# Patient Record
Sex: Female | Born: 1960
Health system: Southern US, Community
[De-identification: ages and names within clinical notes are randomized; demographics above are authoritative.]

## PROBLEM LIST (undated history)

## (undated) DIAGNOSIS — M069 Rheumatoid arthritis, unspecified: Secondary | ICD-10-CM

## (undated) DIAGNOSIS — E8940 Asymptomatic postprocedural ovarian failure: Secondary | ICD-10-CM

## (undated) DIAGNOSIS — J309 Allergic rhinitis, unspecified: Secondary | ICD-10-CM

## (undated) DIAGNOSIS — M8580 Other specified disorders of bone density and structure, unspecified site: Secondary | ICD-10-CM

## (undated) DIAGNOSIS — E559 Vitamin D deficiency, unspecified: Secondary | ICD-10-CM

## (undated) DIAGNOSIS — N6019 Diffuse cystic mastopathy of unspecified breast: Secondary | ICD-10-CM

## (undated) HISTORY — DX: Asymptomatic postprocedural ovarian failure: E89.40

## (undated) HISTORY — DX: Rheumatoid arthritis, unspecified: M06.9

## (undated) HISTORY — DX: Other specified disorders of bone density and structure, unspecified site: M85.80

## (undated) HISTORY — DX: Diffuse cystic mastopathy of unspecified breast: N60.19

## (undated) HISTORY — DX: Vitamin D deficiency, unspecified: E55.9

## (undated) HISTORY — PX: COLONOSCOPY: SHX174

## (undated) HISTORY — DX: Allergic rhinitis, unspecified: J30.9

---

## 1997-05-31 HISTORY — PX: ABDOMINAL HYSTERECTOMY: SHX81

## 2005-04-16 ENCOUNTER — Ambulatory Visit: Payer: Self-pay

## 2005-04-30 ENCOUNTER — Ambulatory Visit: Payer: Self-pay

## 2005-10-29 ENCOUNTER — Ambulatory Visit: Payer: Self-pay | Admitting: Family Medicine

## 2006-03-09 ENCOUNTER — Ambulatory Visit: Payer: Self-pay | Admitting: Family Medicine

## 2007-04-13 ENCOUNTER — Ambulatory Visit: Payer: Self-pay | Admitting: Family Medicine

## 2008-04-16 ENCOUNTER — Ambulatory Visit: Payer: Self-pay | Admitting: Family Medicine

## 2009-06-13 ENCOUNTER — Ambulatory Visit: Payer: Self-pay | Admitting: Family Medicine

## 2010-07-10 ENCOUNTER — Ambulatory Visit: Payer: Self-pay | Admitting: Family Medicine

## 2011-07-13 ENCOUNTER — Ambulatory Visit: Payer: Self-pay | Admitting: Family Medicine

## 2011-07-30 LAB — HM COLONOSCOPY: HM Colonoscopy: NEGATIVE

## 2012-04-11 LAB — HM PAP SMEAR: HM PAP: NORMAL

## 2012-05-03 LAB — HM DEXA SCAN: HM DEXA SCAN: ABNORMAL

## 2012-05-04 ENCOUNTER — Ambulatory Visit: Payer: Self-pay | Admitting: Family Medicine

## 2012-07-25 ENCOUNTER — Ambulatory Visit: Payer: Self-pay | Admitting: Family Medicine

## 2013-07-30 ENCOUNTER — Ambulatory Visit: Payer: Self-pay | Admitting: Family Medicine

## 2013-07-30 LAB — HM MAMMOGRAPHY: HM MAMMO: NORMAL

## 2014-03-11 DIAGNOSIS — M059 Rheumatoid arthritis with rheumatoid factor, unspecified: Secondary | ICD-10-CM | POA: Insufficient documentation

## 2014-04-27 LAB — LIPID PANEL
Cholesterol: 196 mg/dL (ref 0–200)
HDL: 55 mg/dL (ref 35–70)
LDL CALC: 127 mg/dL
TRIGLYCERIDES: 69 mg/dL (ref 40–160)

## 2014-08-01 ENCOUNTER — Ambulatory Visit: Payer: Self-pay | Admitting: Family Medicine

## 2015-07-01 ENCOUNTER — Encounter: Payer: Self-pay | Admitting: Family Medicine

## 2015-07-01 ENCOUNTER — Ambulatory Visit (INDEPENDENT_AMBULATORY_CARE_PROVIDER_SITE_OTHER): Payer: BLUE CROSS/BLUE SHIELD | Admitting: Family Medicine

## 2015-07-01 VITALS — BP 110/74 | HR 60 | Temp 98.0°F | Resp 16 | Ht 64.0 in | Wt 139.6 lb

## 2015-07-01 DIAGNOSIS — N6019 Diffuse cystic mastopathy of unspecified breast: Secondary | ICD-10-CM | POA: Insufficient documentation

## 2015-07-01 DIAGNOSIS — Z13220 Encounter for screening for lipoid disorders: Secondary | ICD-10-CM

## 2015-07-01 DIAGNOSIS — Z23 Encounter for immunization: Secondary | ICD-10-CM

## 2015-07-01 DIAGNOSIS — Z131 Encounter for screening for diabetes mellitus: Secondary | ICD-10-CM | POA: Diagnosis not present

## 2015-07-01 DIAGNOSIS — J302 Other seasonal allergic rhinitis: Secondary | ICD-10-CM | POA: Insufficient documentation

## 2015-07-01 DIAGNOSIS — Z114 Encounter for screening for human immunodeficiency virus [HIV]: Secondary | ICD-10-CM

## 2015-07-01 DIAGNOSIS — E559 Vitamin D deficiency, unspecified: Secondary | ICD-10-CM

## 2015-07-01 DIAGNOSIS — Z1239 Encounter for other screening for malignant neoplasm of breast: Secondary | ICD-10-CM | POA: Diagnosis not present

## 2015-07-01 DIAGNOSIS — Z8639 Personal history of other endocrine, nutritional and metabolic disease: Secondary | ICD-10-CM | POA: Insufficient documentation

## 2015-07-01 DIAGNOSIS — Z0000 Encounter for general adult medical examination without abnormal findings: Secondary | ICD-10-CM

## 2015-07-01 DIAGNOSIS — Z01419 Encounter for gynecological examination (general) (routine) without abnormal findings: Secondary | ICD-10-CM

## 2015-07-01 NOTE — Progress Notes (Signed)
Name: Yvonne Young   MRN: [id number]    DOB: [birth year]   Date:07/01/2015       Progress Note  Subjective  Chief Complaint  Chief Complaint  Patient presents with  . Annual Exam    HPI  Well Woman Exam: she is feeling well, pain on her joints is under control with medication. She denies vaginal dryness, pain during intercourse or urinary complaints.   Patient Active Problem List   Diagnosis Date Noted  . Fibrocystic breast disease 07/01/2015  . History of iron deficiency 07/01/2015  . Allergic rhinitis, seasonal 07/01/2015  . Vitamin D deficiency 07/01/2015  . Seropositive rheumatoid arthritis (HCC) 03/11/2014    Past Surgical History  Procedure Laterality Date  . Abdominal hysterectomy  1999    w/o bilateral oophorectomy    Family History  Problem Relation Age of Onset  . Hypothyroidism Mother     Social History   Social History  . Marital Status: Married    Spouse Name: N/A  . Number of Children: N/A  . Years of Education: N/A   Occupational History  . Not on file.   Social History Main Topics  . Smoking status: Never Smoker   . Smokeless tobacco: Never Used  . Alcohol Use: No  . Drug Use: No  . Sexual Activity:    Partners: Male   Other Topics Concern  . Not on file   Social History Narrative     Current outpatient prescriptions:  .  Cholecalciferol (VITAMIN D3) 1000 units CAPS, Take by mouth., Disp: , Rfl:  .  etanercept (ENBREL SURECLICK) 50 MG/ML injection, Inject into the skin., Disp: , Rfl:  .  methotrexate (RHEUMATREX) 2.5 MG tablet, Take by mouth., Disp: , Rfl:   No Known Allergies   ROS  Constitutional: Negative for fever or weight change.  Respiratory: Negative for cough and shortness of breath.   Cardiovascular: Negative for chest pain or palpitations.  Gastrointestinal: Negative for abdominal pain, no bowel changes.  Musculoskeletal: Negative for gait problem or joint swelling.  Skin: Negative for rash.  Neurological:  Negative for dizziness or headache.  No other specific complaints in a complete review of systems (except as listed in HPI above).  Objective  Filed Vitals:   07/01/15 1120  BP: 110/74  Pulse: 60  Temp: 98 F (36.7 C)  TempSrc: Oral  Resp: 16  Height: 5\' 4"  (1.626 m)  Weight: 139 lb 9.6 oz (63.322 kg)  SpO2: 98%    Body mass index is 23.95 kg/(m^2).  Physical Exam  Constitutional: Patient appears well-developed and well-nourished. No distress.  HENT: Head: Normocephalic and atraumatic. Ears: B TMs ok, no erythema or effusion; Nose: Nose normal. Mouth/Throat: Oropharynx is clear and moist. No oropharyngeal exudate.  Eyes: Conjunctivae and EOM are normal. Pupils are equal, round, and reactive to light. No scleral icterus.  Neck: Normal range of motion. Neck supple. No JVD present. No thyromegaly present.  Cardiovascular: Normal rate, regular rhythm and normal heart sounds.  No murmur heard. No BLE edema. Pulmonary/Chest: Effort normal and breath sounds normal. No respiratory distress. Abdominal: Soft. Bowel sounds are normal, no distension. There is no tenderness. no masses Breast: no lumps or masses, no nipple discharge or rashes FEMALE GENITALIA:  External genitalia normal External urethra normal Pelvic exam not done. Bimanual exam normal without masses RECTAL: not done Musculoskeletal: Normal range of motion, no joint effusions. No gross deformities. No synovitis Neurological: he is alert and oriented to person, place, and time. No  cranial nerve deficit. Coordination, balance, strength, speech and gait are normal.  Skin: Skin is warm and dry. No rash noted. No erythema.  Psychiatric: Patient has a normal mood and affect. behavior is normal. Judgment and thought content normal.  PHQ2/9: Depression screen PHQ 2/9 07/01/2015  Decreased Interest 0  Down, Depressed, Hopeless 0  PHQ - 2 Score 0    Fall Risk: Fall Risk  07/01/2015  Falls in the past year? No    Functional  Status Survey: Is the patient deaf or have difficulty hearing?: No Does the patient have difficulty seeing, even when wearing glasses/contacts?: No Does the patient have difficulty concentrating, remembering, or making decisions?: No Does the patient have difficulty walking or climbing stairs?: No Does the patient have difficulty dressing or bathing?: No Does the patient have difficulty doing errands alone such as visiting a doctor's office or shopping?: No    Assessment & Plan  1. Well woman exam  Discussed importance of 150 minutes of physical activity weekly, eat two servings of fish weekly, eat one serving of tree nuts ( cashews, pistachios, pecans, almonds.Marland Kitchen) every other day, eat 6 servings of fruit/vegetables daily and drink plenty of water and avoid sweet beverages.   2. Breast cancer screening  - MM Digital Screening; Future  3. Vitamin D deficiency  - VITAMIN D 25 Hydroxy (Vit-D Deficiency, Fractures)  4. Need for Tdap vaccination  - Tdap vaccine greater than or equal to 7yo IM  5. Encounter for screening for HIV  - HIV antibody  6. Screening for diabetes mellitus  - Hemoglobin A1c  7. Lipid screening  - Lipid panel

## 2015-07-02 LAB — HIV ANTIBODY (ROUTINE TESTING W REFLEX): HIV SCREEN 4TH GENERATION: NONREACTIVE

## 2015-07-02 LAB — LIPID PANEL
CHOLESTEROL TOTAL: 207 mg/dL — AB (ref 100–199)
Chol/HDL Ratio: 3.3 ratio units (ref 0.0–4.4)
HDL: 63 mg/dL (ref >39)
LDL Calculated: 131 mg/dL — ABNORMAL HIGH (ref 0–99)
Triglycerides: 67 mg/dL (ref 0–149)
VLDL CHOLESTEROL CAL: 13 mg/dL (ref 5–40)

## 2015-07-02 LAB — VITAMIN D 25 HYDROXY (VIT D DEFICIENCY, FRACTURES): Vit D, 25-Hydroxy: 28.2 ng/mL — ABNORMAL LOW (ref 30.0–100.0)

## 2015-07-02 LAB — HEMOGLOBIN A1C
ESTIMATED AVERAGE GLUCOSE: 117 mg/dL
Hgb A1c MFr Bld: 5.7 % — ABNORMAL HIGH (ref 4.8–5.6)

## 2015-08-19 ENCOUNTER — Ambulatory Visit
Admission: RE | Admit: 2015-08-19 | Discharge: 2015-08-19 | Disposition: A | Discharge status: Home / Self Care | Payer: BLUE CROSS/BLUE SHIELD | Source: Ambulatory Visit | Attending: Family Medicine | Admitting: Family Medicine

## 2015-08-19 DIAGNOSIS — Z1231 Encounter for screening mammogram for malignant neoplasm of breast: Secondary | ICD-10-CM | POA: Diagnosis present

## 2015-08-19 DIAGNOSIS — Z1239 Encounter for other screening for malignant neoplasm of breast: Secondary | ICD-10-CM

## 2015-08-26 ENCOUNTER — Encounter: Payer: BLUE CROSS/BLUE SHIELD | Admitting: Family Medicine

## 2015-09-03 DIAGNOSIS — M0579 Rheumatoid arthritis with rheumatoid factor of multiple sites without organ or systems involvement: Secondary | ICD-10-CM | POA: Diagnosis not present

## 2015-09-10 DIAGNOSIS — M0579 Rheumatoid arthritis with rheumatoid factor of multiple sites without organ or systems involvement: Secondary | ICD-10-CM | POA: Diagnosis not present

## 2015-12-10 DIAGNOSIS — M0579 Rheumatoid arthritis with rheumatoid factor of multiple sites without organ or systems involvement: Secondary | ICD-10-CM | POA: Diagnosis not present

## 2016-03-10 DIAGNOSIS — M0579 Rheumatoid arthritis with rheumatoid factor of multiple sites without organ or systems involvement: Secondary | ICD-10-CM | POA: Diagnosis not present

## 2016-06-02 DIAGNOSIS — M0579 Rheumatoid arthritis with rheumatoid factor of multiple sites without organ or systems involvement: Secondary | ICD-10-CM | POA: Diagnosis not present

## 2016-06-09 DIAGNOSIS — M0579 Rheumatoid arthritis with rheumatoid factor of multiple sites without organ or systems involvement: Secondary | ICD-10-CM | POA: Diagnosis not present

## 2016-07-08 ENCOUNTER — Other Ambulatory Visit: Payer: Self-pay | Admitting: Family Medicine

## 2016-07-08 DIAGNOSIS — Z1231 Encounter for screening mammogram for malignant neoplasm of breast: Secondary | ICD-10-CM

## 2016-08-13 ENCOUNTER — Encounter: Payer: BLUE CROSS/BLUE SHIELD | Admitting: Family Medicine

## 2016-08-19 ENCOUNTER — Ambulatory Visit
Admission: RE | Admit: 2016-08-19 | Discharge: 2016-08-19 | Disposition: A | Discharge status: Home / Self Care | Payer: BLUE CROSS/BLUE SHIELD | Source: Ambulatory Visit | Attending: Family Medicine | Admitting: Family Medicine

## 2016-08-19 DIAGNOSIS — Z1231 Encounter for screening mammogram for malignant neoplasm of breast: Secondary | ICD-10-CM | POA: Diagnosis not present

## 2016-09-07 DIAGNOSIS — M0579 Rheumatoid arthritis with rheumatoid factor of multiple sites without organ or systems involvement: Secondary | ICD-10-CM | POA: Diagnosis not present

## 2016-09-17 ENCOUNTER — Ambulatory Visit (INDEPENDENT_AMBULATORY_CARE_PROVIDER_SITE_OTHER): Payer: BLUE CROSS/BLUE SHIELD | Admitting: Family Medicine

## 2016-09-17 ENCOUNTER — Encounter: Payer: Self-pay | Admitting: Family Medicine

## 2016-09-17 VITALS — BP 110/70 | HR 71 | Temp 97.8°F | Resp 16 | Ht 64.0 in | Wt 144.5 lb

## 2016-09-17 DIAGNOSIS — E559 Vitamin D deficiency, unspecified: Secondary | ICD-10-CM | POA: Diagnosis not present

## 2016-09-17 DIAGNOSIS — R739 Hyperglycemia, unspecified: Secondary | ICD-10-CM

## 2016-09-17 DIAGNOSIS — Z13220 Encounter for screening for lipoid disorders: Secondary | ICD-10-CM | POA: Diagnosis not present

## 2016-09-17 DIAGNOSIS — Z01419 Encounter for gynecological examination (general) (routine) without abnormal findings: Secondary | ICD-10-CM

## 2016-09-17 DIAGNOSIS — Z1231 Encounter for screening mammogram for malignant neoplasm of breast: Secondary | ICD-10-CM

## 2016-09-17 DIAGNOSIS — Z8349 Family history of other endocrine, nutritional and metabolic diseases: Secondary | ICD-10-CM

## 2016-09-17 DIAGNOSIS — Z1239 Encounter for other screening for malignant neoplasm of breast: Secondary | ICD-10-CM

## 2016-09-17 LAB — COMPLETE METABOLIC PANEL WITH GFR
ALT: 10 U/L (ref 6–29)
AST: 17 U/L (ref 10–35)
Albumin: 4.1 g/dL (ref 3.6–5.1)
Alkaline Phosphatase: 64 U/L (ref 33–130)
BUN: 11 mg/dL (ref 7–25)
CALCIUM: 9.5 mg/dL (ref 8.6–10.4)
CHLORIDE: 107 mmol/L (ref 98–110)
CO2: 26 mmol/L (ref 20–31)
Creat: 0.82 mg/dL (ref 0.50–1.05)
GFR, EST NON AFRICAN AMERICAN: 81 mL/min (ref >=60)
Glucose, Bld: 70 mg/dL (ref 65–99)
POTASSIUM: 4.1 mmol/L (ref 3.5–5.3)
Sodium: 141 mmol/L (ref 135–146)
Total Bilirubin: 0.4 mg/dL (ref 0.2–1.2)
Total Protein: 7.2 g/dL (ref 6.1–8.1)

## 2016-09-17 LAB — LIPID PANEL
Cholesterol: 217 mg/dL — ABNORMAL HIGH (ref <200)
HDL: 54 mg/dL (ref >50)
LDL Cholesterol: 144 mg/dL — ABNORMAL HIGH (ref <100)
Total CHOL/HDL Ratio: 4 Ratio (ref <5.0)
Triglycerides: 93 mg/dL (ref <150)
VLDL: 19 mg/dL (ref <30)

## 2016-09-17 LAB — TSH: TSH: 1.5 m[IU]/L

## 2016-09-17 NOTE — Progress Notes (Addendum)
Name: Yvonne Young   MRN: [id number]    DOB: 11/20/1960   Date:09/17/2016       Progress Note  Subjective  Chief Complaint  Chief Complaint  Patient presents with  . Annual Exam    CPE    HPI  Well Woman Exam: she is feeling well, pain on her joints is under control with medication, still seeing Dr. Gavin Potters for RA and down on Enbrel from every week to every other week. Still on Methothrexate.She states she is feeling well. She states night sweats not as frequent, occasionally has hot flashes. She denies vaginal dryness, no pain during intercourse. Normal libido. She is dating but she works two jobs and lives alone , so not very sexually active. No breast lumps.    Patient Active Problem List   Diagnosis Date Noted  . Fibrocystic breast disease 07/01/2015  . History of iron deficiency 07/01/2015  . Allergic rhinitis, seasonal 07/01/2015  . Vitamin D deficiency 07/01/2015  . Seropositive rheumatoid arthritis (HCC) 03/11/2014    Past Surgical History:  Procedure Laterality Date  . ABDOMINAL HYSTERECTOMY  1999   w/o bilateral oophorectomy    Family History  Problem Relation Age of Onset  . Hypothyroidism Mother   . Hypertension Mother     Social History   Social History  . Marital status: Single    Spouse name: N/A  . Number of children: N/A  . Years of education: N/A   Occupational History  . 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 Smoking status: Never Smoker  . Smokeless tobacco: Never Used  . Alcohol use No  . Drug use: No  . Sexual activity: Yes    Partners: Male   Other Topics Concern  . Not on file   Social History Narrative   Lives by herself, dating   She has two jobs     Current Outpatient Prescriptions:  .  Cholecalciferol (VITAMIN D3) 1000 units CAPS, Take by mouth., Disp: , Rfl:  .  etanercept (ENBREL SURECLICK) 50 MG/ML injection, Inject into the skin., Disp: , Rfl:  .   methotrexate (RHEUMATREX) 2.5 MG tablet, Take by mouth., Disp: , Rfl:   No Known Allergies   ROS  Constitutional: Negative for fever or weight change.  Respiratory: Negative for cough and shortness of breath.   Cardiovascular: Negative for chest pain or palpitations.  Gastrointestinal: Negative for abdominal pain, no bowel changes.  Musculoskeletal: Negative for gait problem or joint swelling. Doing well on medication - sees Dr. Gavin Potters Skin: Negative for rash.  Neurological: Negative for dizziness or headache.  No other specific complaints in a complete review of systems (except as listed in HPI above).   Objective  Vitals:   09/17/16 0923  BP: 110/70  Pulse: 71  Resp: 16  Temp: 97.8 F (36.6 C)  TempSrc: Oral  SpO2: 98%  Weight: 144 lb 8 oz (65.5 kg)  Height: 5\' 4"  (1.626 m)    Body mass index is 24.8 kg/m.  Physical Exam  Constitutional: Patient appears well-developed and well-nourished. No distress.  HENT: Head: Normocephalic and atraumatic. Ears: B TMs ok, no erythema or effusion; Nose: Nose normal. Mouth/Throat: Oropharynx is clear and moist. No oropharyngeal exudate.  Eyes: Conjunctivae and EOM are normal. Pupils are equal, round, and reactive to light. No scleral icterus.  Neck: Normal range of motion. Neck supple. No JVD present. No thyromegaly present.  Cardiovascular: Normal rate, regular rhythm  and normal heart sounds.  No murmur heard. No BLE edema. Pulmonary/Chest: Effort normal and breath sounds normal. No respiratory distress. Abdominal: Soft. Bowel sounds are normal, no distension. There is no tenderness. no masses Breast: no lumps or masses, no nipple discharge or rashes FEMALE GENITALIA:  External genitalia normal External urethra normal Pelvic not done RECTAL: not done Musculoskeletal: Normal range of motion, no joint effusions. No gross deformities Neurological: he is alert and oriented to person, place, and time. No cranial nerve deficit.  Coordination, balance, strength, speech and gait are normal.  Skin: Skin is warm and dry. No rash noted. No erythema.  Psychiatric: Patient has a normal mood and affect. behavior is normal. Judgment and thought content normal.  PHQ2/9: Depression screen [facility] 2/9 09/17/2016 07/01/2015  Decreased Interest 0 0  Down, Depressed, Hopeless 0 0  PHQ - 2 Score 0 0    Fall Risk: Fall Risk  09/17/2016 07/01/2015  Falls in the past year? No No    Functional Status Survey: Is the patient deaf or have difficulty hearing?: No Does the patient have difficulty seeing, even when wearing glasses/contacts?: Yes (glasses) Does the patient have difficulty concentrating, remembering, or making decisions?: No Does the patient have difficulty walking or climbing stairs?: No Does the patient have difficulty dressing or bathing?: No Does the patient have difficulty doing errands alone such as visiting a doctor's office or shopping?: No    Assessment & Plan  1. Well woman exam  Discussed importance of 150 minutes of physical activity weekly, eat two servings of fish weekly, eat one serving of tree nuts ( cashews, pistachios, pecans, almonds.Marland Kitchen) every other day, eat 6 servings of fruit/vegetables daily and drink plenty of water and avoid sweet beverages.  - COMPLETE METABOLIC PANEL WITH GFR  2. Breast cancer screening  Up to date and normal mammogram   3. Vitamin D deficiency  - VITAMIN D 25 Hydroxy (Vit-D Deficiency, Fractures)  4. Lipid screening  - Lipid panel  5. Hyperglycemia  - Hemoglobin A1c   6. Family history of thyroid disease  - TSH

## 2016-09-17 NOTE — Patient Instructions (Signed)

## 2016-09-18 LAB — HEMOGLOBIN A1C
Hgb A1c MFr Bld: 5.3 % (ref <5.7)
Mean Plasma Glucose: 105 mg/dL

## 2016-09-18 LAB — VITAMIN D 25 HYDROXY (VIT D DEFICIENCY, FRACTURES): Vit D, 25-Hydroxy: 26 ng/mL — ABNORMAL LOW (ref 30–100)

## 2016-12-07 DIAGNOSIS — M0579 Rheumatoid arthritis with rheumatoid factor of multiple sites without organ or systems involvement: Secondary | ICD-10-CM | POA: Diagnosis not present

## 2017-03-08 DIAGNOSIS — M0579 Rheumatoid arthritis with rheumatoid factor of multiple sites without organ or systems involvement: Secondary | ICD-10-CM | POA: Diagnosis not present

## 2017-06-07 DIAGNOSIS — M0579 Rheumatoid arthritis with rheumatoid factor of multiple sites without organ or systems involvement: Secondary | ICD-10-CM | POA: Diagnosis not present

## 2017-06-15 DIAGNOSIS — Z79899 Other long term (current) drug therapy: Secondary | ICD-10-CM | POA: Diagnosis not present

## 2017-06-15 DIAGNOSIS — M0579 Rheumatoid arthritis with rheumatoid factor of multiple sites without organ or systems involvement: Secondary | ICD-10-CM | POA: Diagnosis not present

## 2017-07-22 ENCOUNTER — Other Ambulatory Visit: Payer: Self-pay | Admitting: Family Medicine

## 2017-09-12 DIAGNOSIS — M0579 Rheumatoid arthritis with rheumatoid factor of multiple sites without organ or systems involvement: Secondary | ICD-10-CM | POA: Diagnosis not present

## 2017-09-12 DIAGNOSIS — Z79899 Other long term (current) drug therapy: Secondary | ICD-10-CM | POA: Diagnosis not present

## 2017-09-21 ENCOUNTER — Encounter: Payer: Self-pay | Admitting: Family Medicine

## 2017-09-21 ENCOUNTER — Ambulatory Visit (INDEPENDENT_AMBULATORY_CARE_PROVIDER_SITE_OTHER): Payer: BLUE CROSS/BLUE SHIELD | Admitting: Family Medicine

## 2017-09-21 VITALS — BP 104/70 | HR 82 | Temp 98.3°F | Resp 16 | Ht 64.5 in | Wt 140.1 lb

## 2017-09-21 DIAGNOSIS — Z1231 Encounter for screening mammogram for malignant neoplasm of breast: Secondary | ICD-10-CM

## 2017-09-21 DIAGNOSIS — R739 Hyperglycemia, unspecified: Secondary | ICD-10-CM

## 2017-09-21 DIAGNOSIS — Z01419 Encounter for gynecological examination (general) (routine) without abnormal findings: Secondary | ICD-10-CM | POA: Diagnosis not present

## 2017-09-21 DIAGNOSIS — E559 Vitamin D deficiency, unspecified: Secondary | ICD-10-CM

## 2017-09-21 DIAGNOSIS — Z13220 Encounter for screening for lipoid disorders: Secondary | ICD-10-CM | POA: Diagnosis not present

## 2017-09-21 DIAGNOSIS — M059 Rheumatoid arthritis with rheumatoid factor, unspecified: Secondary | ICD-10-CM | POA: Diagnosis not present

## 2017-09-21 DIAGNOSIS — R001 Bradycardia, unspecified: Secondary | ICD-10-CM | POA: Diagnosis not present

## 2017-09-21 DIAGNOSIS — Z1239 Encounter for other screening for malignant neoplasm of breast: Secondary | ICD-10-CM

## 2017-09-21 LAB — LIPID PANEL
Cholesterol: 231 mg/dL — ABNORMAL HIGH (ref <200)
HDL: 53 mg/dL (ref >50)
LDL Cholesterol (Calc): 159 mg/dL (calc) — ABNORMAL HIGH
Non-HDL Cholesterol (Calc): 178 mg/dL (calc) — ABNORMAL HIGH (ref <130)
TRIGLYCERIDES: 87 mg/dL (ref <150)
Total CHOL/HDL Ratio: 4.4 (calc) (ref <5.0)

## 2017-09-21 MED ORDER — METHOTREXATE 2.5 MG PO TABS
10.0000 mg | ORAL_TABLET | ORAL | 0 refills | Status: AC
Start: 2017-09-21 — End: ?

## 2017-09-21 NOTE — Patient Instructions (Signed)
Preventive Care 40-64 Years, Female Preventive care refers to lifestyle choices and visits with your health care provider that can promote health and wellness. What does preventive care include?  A yearly physical exam. This is also called an annual well check.  Dental exams once or twice a year.  Routine eye exams. Ask your health care provider how often you should have your eyes checked.  Personal lifestyle choices, including: ? Daily care of your teeth and gums. ? Regular physical activity. ? Eating a healthy diet. ? Avoiding tobacco and drug use. ? Limiting alcohol use. ? Practicing safe sex. ? Taking low-dose aspirin daily starting at age 57. ? Taking vitamin and mineral supplements as recommended by your health care provider. What happens during an annual well check? The services and screenings done by your health care provider during your annual well check will depend on your age, overall health, lifestyle risk factors, and family history of disease. Counseling Your health care provider may ask you questions about your:  Alcohol use.  Tobacco use.  Drug use.  Emotional well-being.  Home and relationship well-being.  Sexual activity.  Eating habits.  Work and work Statistician.  Method of birth control.  Menstrual cycle.  Pregnancy history.  Screening You may have the following tests or measurements:  Height, weight, and BMI.  Blood pressure.  Lipid and cholesterol levels. These may be checked every 5 years, or more frequently if you are over 57 years old.  Skin check.  Lung cancer screening. You may have this screening every year starting at age 57 if you have a 30-pack-year history of smoking and currently smoke or have quit within the past 15 years.  Fecal occult blood test (FOBT) of the stool. You may have this test every year starting at age 57.  Flexible sigmoidoscopy or colonoscopy. You may have a sigmoidoscopy every 5 years or a colonoscopy  every 10 years starting at age 57.  Hepatitis C blood test.  Hepatitis B blood test.  Sexually transmitted disease (STD) testing.  Diabetes screening. This is done by checking your blood sugar (glucose) after you have not eaten for a while (fasting). You may have this done every 1-3 years.  Mammogram. This may be done every 1-2 years. Talk to your health care provider about when you should start having regular mammograms. This may depend on whether you have a family history of breast cancer.  BRCA-related cancer screening. This may be done if you have a family history of breast, ovarian, tubal, or peritoneal cancers.  Pelvic exam and Pap test. This may be done every 3 years starting at age 57. Starting at age 57, this may be done every 5 years if you have a Pap test in combination with an HPV test.  Bone density scan. This is done to screen for osteoporosis. You may have this scan if you are at high risk for osteoporosis.  Discuss your test results, treatment options, and if necessary, the need for more tests with your health care provider. Vaccines Your health care provider may recommend certain vaccines, such as:  Influenza vaccine. This is recommended every year.  Tetanus, diphtheria, and acellular pertussis (Tdap, Td) vaccine. You may need a Td booster every 10 years.  Varicella vaccine. You may need this if you have not been vaccinated.  Zoster vaccine. You may need this after age 57.  Measles, mumps, and rubella (MMR) vaccine. You may need at least one dose of MMR if you were born in  1957 or later. You may also need a second dose.  Pneumococcal 13-valent conjugate (PCV13) vaccine. You may need this if you have certain conditions and were not previously vaccinated.  Pneumococcal polysaccharide (PPSV23) vaccine. You may need one or two doses if you smoke cigarettes or if you have certain conditions.  Meningococcal vaccine. You may need this if you have certain  conditions.  Hepatitis A vaccine. You may need this if you have certain conditions or if you travel or work in places where you may be exposed to hepatitis A.  Hepatitis B vaccine. You may need this if you have certain conditions or if you travel or work in places where you may be exposed to hepatitis B.  Haemophilus influenzae type b (Hib) vaccine. You may need this if you have certain conditions.  Talk to your health care provider about which screenings and vaccines you need and how often you need them. This information is not intended to replace advice given to you by your health care provider. Make sure you discuss any questions you have with your health care provider. Document Released: 06/13/2015 Document Revised: 02/04/2016 Document Reviewed: 03/18/2015 Elsevier Interactive Patient Education  2018 Elsevier Inc.  

## 2017-09-21 NOTE — Progress Notes (Signed)
Name: Yvonne Young   MRN: [id number]    DOB: 1961/02/17   Date:09/21/2017       Progress Note  Subjective  Chief Complaint  Chief Complaint  Patient presents with  . Annual Exam    HPI   Patient presents for annual CPE   USPSTF grade A and B recommendations  Depression:  Depression screen [facility] 2/9 09/21/2017 09/17/2016 07/01/2015  Decreased Interest 0 0 0  Down, Depressed, Hopeless 0 0 0  PHQ - 2 Score 0 0 0   Hypertension: BP Readings from Last 3 Encounters:  09/21/17 104/70  09/17/16 110/70  07/01/15 110/74   Obesity: Wt Readings from Last 3 Encounters:  09/21/17 140 lb 1.6 oz (63.5 kg)  09/17/16 144 lb 8 oz (65.5 kg)  07/01/15 139 lb 9.6 oz (63.3 kg)   BMI Readings from Last 3 Encounters:  09/21/17 23.68 kg/m  09/17/16 24.80 kg/m  07/01/15 23.96 kg/m     HIV, hep B, hep C: HIV negative in 2017, same partner since  STD testing and prevention (chl/gon/syphilis): she wants to hold off  Intimate partner violence:negative screen  Sexual History/Pain during Intercourse: no pain  Menstrual History/LMP/Abnormal Bleeding: s/p hysterectomy - benign cause Incontinence Symptoms: none  Advanced Care Planning: A voluntary discussion about advance care planning including the explanation and discussion of advance directives.  Discussed health care proxy and Living will, and the patient was able to identify a health care proxy as sister Yvonne Young) .  Patient does not have a living will at present time. If patient does have living will, I have requested they bring this to the clinic to be scanned in to their chart.  Breast cancer:  HM Mammogram  Date Value Ref Range Status  07/30/2013 Normal  Final    BRCA gene screening: N/A Cervical cancer screening: no need, previous pap normal, s/p hysterectomy for benign reason  Osteoporosis:  HM Dexa Scan  Date Value Ref Range Status  05/03/2012 Abnormal 0.4/-1.8/-13 Osteopenia on Femur  Final   Lipids:  Lab Results   Component Value Date   CHOL 217 (H) 09/17/2016   CHOL 207 (H) 07/01/2015   CHOL 196 04/27/2014   Lab Results  Component Value Date   HDL 54 09/17/2016   HDL 63 07/01/2015   HDL 55 04/27/2014   Lab Results  Component Value Date   LDLCALC 144 (H) 09/17/2016   LDLCALC 131 (H) 07/01/2015   LDLCALC 127 04/27/2014   Lab Results  Component Value Date   TRIG 93 09/17/2016   TRIG 67 07/01/2015   TRIG 69 04/27/2014   Lab Results  Component Value Date   CHOLHDL 4.0 09/17/2016   CHOLHDL 3.3 07/01/2015   No results found for: LDLDIRECT  Glucose:  Glucose, Bld  Date Value Ref Range Status  09/17/2016 70 65 - 99 mg/dL Final     Colorectal cancer: 57  years old. Lung cancer: Low Dose CT Chest recommended if Age 67-80 years, 30 pack-year currently smoking OR have quit w/in 15years. Patient does not qualify.   ECG: today    Patient Active Problem List   Diagnosis Date Noted  . Fibrocystic breast disease 07/01/2015  . History of iron deficiency 07/01/2015  . Allergic rhinitis, seasonal 07/01/2015  . Vitamin D deficiency 07/01/2015  . Seropositive rheumatoid arthritis ([place]) 03/11/2014    Past Surgical History:  Procedure Laterality Date  . ABDOMINAL HYSTERECTOMY  1999   w/o bilateral oophorectomy    Family History  Problem  Relation Age of Onset  . Hypothyroidism Mother   . Hypertension Mother     Social History   Socioeconomic History  . Marital status: Single    Spouse name: Not on file  . Number of children: 1  . Years of education: 5  . Highest education level: High school graduate  Occupational History  . Occupation: Engineer, manufacturing systems: [employer]  . Occupation: Therapist, art    Employer: [employer]  . Financial resource strain: Not hard at all  . Food insecurity:    Worry: Never true    Inability: Never true  . Transportation needs:    Medical: No    Non-medical: No  Tobacco Use  . Smoking status: Never  Smoker  . Smokeless tobacco: Never Used  Substance and Sexual Activity  . Alcohol use: No    Alcohol/week: 0.0 oz  . Drug use: No  . Sexual activity: Yes    Partners: Male  Lifestyle  . Physical activity:    Days per week: 0 days    Minutes per session: 0 min  . Stress: Not at all  Relationships  . Social connections:    Talks on phone: Three times a week    Gets together: Once a week    Attends religious service: More than 4 times per year    Active member of club or organization: Yes    Attends meetings of clubs or organizations: Never    Relationship status: Widowed  . Intimate partner violence:    Fear of current or ex partner: No    Emotionally abused: No    Physically abused: No    Forced sexual activity: No  Other Topics Concern  . Not on file  Social History Narrative   Lives by herself, dating, she was married at age 49 and divorced at age 58 , re-married at age 74 and widow after one year.    She has two jobs     Current Outpatient Medications:  .  Cholecalciferol (VITAMIN D3) 1000 units CAPS, Take by mouth., Disp: , Rfl:  .  etanercept (ENBREL SURECLICK) 50 MG/ML injection, Inject into the skin., Disp: , Rfl:  .  methotrexate (RHEUMATREX) 2.5 MG tablet, Take by mouth., Disp: , Rfl:   Allergies  Allergen Reactions  . Amoxicillin Nausea Only    Had nausea for 3 days, and this stopped when she discontinued the medication.     ROS   Constitutional: Negative for fever or weight change.  Respiratory: Negative for cough and shortness of breath.   Cardiovascular: Negative for chest pain or palpitations.  Gastrointestinal: Negative for abdominal pain, no bowel changes.  Musculoskeletal: Negative for gait problem or joint swelling.  Skin: Negative for rash.  Neurological: Negative for dizziness or headache.  No other specific complaints in a complete review of systems (except as listed in HPI above).   Objective  Vitals:   09/21/17 0822  BP: 104/70   Pulse: 82  Resp: 16  Temp: 98.3 F (36.8 C)  TempSrc: Oral  SpO2: 98%  Weight: 140 lb 1.6 oz (63.5 kg)  Height: 5' 4.5" (1.638 m)    Body mass index is 23.68 kg/m.  Physical Exam  Constitutional: Patient appears well-developed and well-nourished. No distress.  HENT: Head: Normocephalic and atraumatic. Ears: B TMs ok, no erythema or effusion; Nose: Nose normal. Mouth/Throat: Oropharynx is clear and moist. No oropharyngeal exudate.  Eyes: Conjunctivae and EOM are normal. Pupils are  equal, round, and reactive to light. No scleral icterus.  Neck: Normal range of motion. Neck supple. No JVD present. No thyromegaly present.  Cardiovascular: Normal rate, regular rhythm and normal heart sounds.  No murmur heard. No BLE edema. Pulmonary/Chest: Effort normal and breath sounds normal. No respiratory distress. Abdominal: Soft. Bowel sounds are normal, no distension. There is no tenderness. no masses Breast: no lumps or masses, no nipple discharge or rashes FEMALE GENITALIA:  Pelvic not done - previous pap normal and no endocervical cell  RECTAL: not done Musculoskeletal: Normal range of motion, no joint effusions. No gross deformities Neurological: he is alert and oriented to person, place, and time. No cranial nerve deficit. Coordination, balance, strength, speech and gait are normal.  Skin: Skin is warm and dry. No rash noted. No erythema.  Psychiatric: Patient has a normal mood and affect. behavior is normal. Judgment and thought content normal.    PHQ2/9: Depression screen [facility] 2/9 09/21/2017 09/17/2016 07/01/2015  Decreased Interest 0 0 0  Down, Depressed, Hopeless 0 0 0  PHQ - 2 Score 0 0 0     Fall Risk: Fall Risk  09/21/2017 09/21/2017 09/17/2016 07/01/2015  Falls in the past year? No No No No     Functional Status Survey: Is the patient deaf or have difficulty hearing?: No Does the patient have difficulty seeing, even when wearing glasses/contacts?: No Does the patient have  difficulty concentrating, remembering, or making decisions?: No Does the patient have difficulty walking or climbing stairs?: No Does the patient have difficulty dressing or bathing?: No Does the patient have difficulty doing errands alone such as visiting a doctor's office or shopping?: No   Assessment & Plan  1. Well woman exam  Discussed importance of 150 minutes of physical activity weekly, eat two servings of fish weekly, eat one serving of tree nuts ( cashews, pistachios, pecans, almonds.Marland Kitchen) every other day, eat 6 servings of fruit/vegetables daily and drink plenty of water and avoid sweet beverages.  - VITAMIN D 25 Hydroxy (Vit-D Deficiency, Fractures)  2. Seropositive rheumatoid arthritis ([place])  Continue follow up with Dr. Jefm Bryant  - methotrexate (RHEUMATREX) 2.5 MG tablet; Take 4 tablets (10 mg total) by mouth once a week.  Dispense: 16 tablet; Refill: 0  3. Breast cancer screening  - MM DIGITAL SCREENING BILATERAL; Future  4. Vitamin D deficiency  -vitamin D level   5. Lipid screening  - Lipid panel -EKG : sinus bradycardia  6. Hyperglycemia  - Hemoglobin A1c

## 2017-09-22 LAB — VITAMIN D 25 HYDROXY (VIT D DEFICIENCY, FRACTURES): Vit D, 25-Hydroxy: 28 ng/mL — ABNORMAL LOW (ref 30–100)

## 2017-09-22 LAB — HEMOGLOBIN A1C
HEMOGLOBIN A1C: 5.8 %{Hb} — AB (ref <5.7)
MEAN PLASMA GLUCOSE: 120 (calc)
eAG (mmol/L): 6.6 (calc)

## 2017-10-06 ENCOUNTER — Ambulatory Visit
Admission: RE | Admit: 2017-10-06 | Discharge: 2017-10-06 | Disposition: A | Discharge status: Home / Self Care | Payer: BLUE CROSS/BLUE SHIELD | Source: Ambulatory Visit | Attending: Family Medicine | Admitting: Family Medicine

## 2017-10-06 DIAGNOSIS — Z1239 Encounter for other screening for malignant neoplasm of breast: Secondary | ICD-10-CM

## 2017-10-06 DIAGNOSIS — Z1231 Encounter for screening mammogram for malignant neoplasm of breast: Secondary | ICD-10-CM | POA: Diagnosis not present

## 2017-12-13 DIAGNOSIS — Z79899 Other long term (current) drug therapy: Secondary | ICD-10-CM | POA: Diagnosis not present

## 2017-12-13 DIAGNOSIS — M0579 Rheumatoid arthritis with rheumatoid factor of multiple sites without organ or systems involvement: Secondary | ICD-10-CM | POA: Diagnosis not present

## 2018-03-15 DIAGNOSIS — M0579 Rheumatoid arthritis with rheumatoid factor of multiple sites without organ or systems involvement: Secondary | ICD-10-CM | POA: Diagnosis not present

## 2018-03-15 DIAGNOSIS — Z79899 Other long term (current) drug therapy: Secondary | ICD-10-CM | POA: Diagnosis not present

## 2018-06-07 DIAGNOSIS — Z79899 Other long term (current) drug therapy: Secondary | ICD-10-CM | POA: Diagnosis not present

## 2018-06-07 DIAGNOSIS — M0579 Rheumatoid arthritis with rheumatoid factor of multiple sites without organ or systems involvement: Secondary | ICD-10-CM | POA: Diagnosis not present

## 2018-06-15 DIAGNOSIS — Z79899 Other long term (current) drug therapy: Secondary | ICD-10-CM | POA: Diagnosis not present

## 2018-06-15 DIAGNOSIS — M0579 Rheumatoid arthritis with rheumatoid factor of multiple sites without organ or systems involvement: Secondary | ICD-10-CM | POA: Diagnosis not present

## 2018-09-13 DIAGNOSIS — M0579 Rheumatoid arthritis with rheumatoid factor of multiple sites without organ or systems involvement: Secondary | ICD-10-CM | POA: Diagnosis not present

## 2018-09-13 DIAGNOSIS — Z79899 Other long term (current) drug therapy: Secondary | ICD-10-CM | POA: Diagnosis not present

## 2018-09-21 ENCOUNTER — Other Ambulatory Visit: Payer: Self-pay | Admitting: Family Medicine

## 2018-09-21 DIAGNOSIS — Z1231 Encounter for screening mammogram for malignant neoplasm of breast: Secondary | ICD-10-CM

## 2018-09-25 ENCOUNTER — Encounter: Payer: BLUE CROSS/BLUE SHIELD | Admitting: Family Medicine

## 2018-09-27 ENCOUNTER — Other Ambulatory Visit: Payer: Self-pay

## 2018-09-27 ENCOUNTER — Encounter: Payer: Self-pay | Admitting: Family Medicine

## 2018-09-27 ENCOUNTER — Ambulatory Visit (INDEPENDENT_AMBULATORY_CARE_PROVIDER_SITE_OTHER): Payer: BLUE CROSS/BLUE SHIELD | Admitting: Family Medicine

## 2018-09-27 VITALS — BP 124/82 | HR 96 | Temp 98.0°F | Resp 16 | Ht 63.5 in | Wt 136.8 lb

## 2018-09-27 DIAGNOSIS — Z13220 Encounter for screening for lipoid disorders: Secondary | ICD-10-CM

## 2018-09-27 DIAGNOSIS — R739 Hyperglycemia, unspecified: Secondary | ICD-10-CM | POA: Diagnosis not present

## 2018-09-27 DIAGNOSIS — E559 Vitamin D deficiency, unspecified: Secondary | ICD-10-CM

## 2018-09-27 DIAGNOSIS — Z23 Encounter for immunization: Secondary | ICD-10-CM

## 2018-09-27 DIAGNOSIS — R7303 Prediabetes: Secondary | ICD-10-CM | POA: Diagnosis not present

## 2018-09-27 DIAGNOSIS — E2839 Other primary ovarian failure: Secondary | ICD-10-CM

## 2018-09-27 DIAGNOSIS — M059 Rheumatoid arthritis with rheumatoid factor, unspecified: Secondary | ICD-10-CM

## 2018-09-27 DIAGNOSIS — M810 Age-related osteoporosis without current pathological fracture: Secondary | ICD-10-CM

## 2018-09-27 DIAGNOSIS — Z1239 Encounter for other screening for malignant neoplasm of breast: Secondary | ICD-10-CM

## 2018-09-27 DIAGNOSIS — Z1159 Encounter for screening for other viral diseases: Secondary | ICD-10-CM

## 2018-09-27 DIAGNOSIS — M8580 Other specified disorders of bone density and structure, unspecified site: Secondary | ICD-10-CM

## 2018-09-27 DIAGNOSIS — Z01419 Encounter for gynecological examination (general) (routine) without abnormal findings: Secondary | ICD-10-CM

## 2018-09-27 DIAGNOSIS — Z780 Asymptomatic menopausal state: Secondary | ICD-10-CM

## 2018-09-27 NOTE — Patient Instructions (Addendum)
Call insurance to find out about [employer] coverage Also call [facility] for bone density test   Preventive Care 40-64 Years, Female   Preventive care refers to lifestyle choices and visits with your health care provider that can promote health and wellness. What does preventive care include?   A yearly physical exam. This is also called an annual well check.  Dental exams once or twice a year.  Routine eye exams. Ask your health care provider how often you should have your eyes checked.  Personal lifestyle choices, including: ? Daily care of your teeth and gums. ? Regular physical activity. ? Eating a healthy diet. ? Avoiding tobacco and drug use. ? Limiting alcohol use. ? Practicing safe sex. ? Taking low-dose aspirin daily starting at age 39. ? Taking vitamin and mineral supplements as recommended by your health care provider. What happens during an annual well check? The services and screenings done by your health care provider during your annual well check will depend on your age, overall health, lifestyle risk factors, and family history of disease. Counseling Your health care provider may ask you questions about your:  Alcohol use.  Tobacco use.  Drug use.  Emotional well-being.  Home and relationship well-being.  Sexual activity.  Eating habits.  Work and work Statistician.  Method of birth control.  Menstrual cycle.  Pregnancy history. Screening You may have the following tests or measurements:  Height, weight, and BMI.  Blood pressure.  Lipid and cholesterol levels. These may be checked every 5 years, or more frequently if you are over 17 years old.  Skin check.  Lung cancer screening. You may have this screening every year starting at age 78 if you have a 30-pack-year history of smoking and currently smoke or have quit within the past 15 years.  Colorectal cancer screening. All adults should have this screening starting at age  21 and continuing until age 50. Your health care provider may recommend screening at age 9. You will have tests every 1-10 years, depending on your results and the type of screening test. People at increased risk should start screening at an earlier age. Screening tests may include: ? Guaiac-based fecal occult blood testing. ? Fecal immunochemical test (FIT). ? Stool DNA test. ? Virtual colonoscopy. ? Sigmoidoscopy. During this test, a flexible tube with a tiny camera (sigmoidoscope) is used to examine your rectum and lower colon. The sigmoidoscope is inserted through your anus into your rectum and lower colon. ? Colonoscopy. During this test, a long, thin, flexible tube with a tiny camera (colonoscope) is used to examine your entire colon and rectum.  Hepatitis C blood test.  Hepatitis B blood test.  Sexually transmitted disease (STD) testing.  Diabetes screening. This is done by checking your blood sugar (glucose) after you have not eaten for a while (fasting). You may have this done every 1-3 years.  Mammogram. This may be done every 1-2 years. Talk to your health care provider about when you should start having regular mammograms. This may depend on whether you have a family history of breast cancer.  BRCA-related cancer screening. This may be done if you have a family history of breast, ovarian, tubal, or peritoneal cancers.  Pelvic exam and Pap test. This may be done every 3 years starting at age 1. Starting at age 41, this may be done every 5 years if you have a Pap test in combination with an HPV test.  Bone density scan. This is done to  screen for osteoporosis. You may have this scan if you are at high risk for osteoporosis. Discuss your test results, treatment options, and if necessary, the need for more tests with your health care provider. Vaccines Your health care provider may recommend certain vaccines, such as:  Influenza vaccine. This is recommended every year.   Tetanus, diphtheria, and acellular pertussis (Tdap, Td) vaccine. You may need a Td booster every 10 years.  Varicella vaccine. You may need this if you have not been vaccinated.  Zoster vaccine. You may need this after age 86.  Measles, mumps, and rubella (MMR) vaccine. You may need at least one dose of MMR if you were born in 1957 or later. You may also need a second dose.  Pneumococcal 13-valent conjugate (PCV13) vaccine. You may need this if you have certain conditions and were not previously vaccinated.  Pneumococcal polysaccharide (PPSV23) vaccine. You may need one or two doses if you smoke cigarettes or if you have certain conditions.  Meningococcal vaccine. You may need this if you have certain conditions.  Hepatitis A vaccine. You may need this if you have certain conditions or if you travel or work in places where you may be exposed to hepatitis A.  Hepatitis B vaccine. You may need this if you have certain conditions or if you travel or work in places where you may be exposed to hepatitis B.  Haemophilus influenzae type b (Hib) vaccine. You may need this if you have certain conditions. Talk to your health care provider about which screenings and vaccines you need and how often you need them. This information is not intended to replace advice given to you by your health care provider. Make sure you discuss any questions you have with your health care provider. Document Released: 06/13/2015 Document Revised: 07/07/2017 Document Reviewed: 03/18/2015 Elsevier Interactive Patient Education  2019 [employer].

## 2018-09-27 NOTE — Progress Notes (Signed)
Name: Yvonne Young   MRN: [id number]    DOB: 07-Apr-1961   Date:09/27/2018       Progress Note  Subjective  Chief Complaint  Chief Complaint  Patient presents with  . Annual Exam    HPI   Patient presents for annual CPE and follow up  RA: seeing Dr. Jefm Young once a year, last visit 05/2018, labs still every 3 months, last one was a couple of weeks ago , reviewed today and normal. Still has aches and pains mostly on her hands but only when typing a lot, takes medications as prescribed.  Pre-diabetes: HgbA1C was 5.8%, she denies polyphagia, polydipsia or polyuria. She is not obese we will check for antibodies   Diet: she avoids red meat, likes fish, eats a lot of fruit and vegetables Exercise: discussed 150 minutes weekly   USPSTF grade A and B recommendations    Office Visit from 09/27/2018 in [facility]  AUDIT-C Score  0     Depression: Phq 9 is  negative Depression screen [facility] 2/9 09/27/2018 09/21/2017 09/17/2016 07/01/2015  Decreased Interest 0 0 0 0  Down, Depressed, Hopeless 0 0 0 0  PHQ - 2 Score 0 0 0 0  Altered sleeping 0 - - -  Tired, decreased energy 0 - - -  Change in appetite 0 - - -  Feeling bad or failure about yourself  0 - - -  Trouble concentrating 0 - - -  Moving slowly or fidgety/restless 0 - - -  Suicidal thoughts 0 - - -  PHQ-9 Score 0 - - -  Difficult doing work/chores Not difficult at all - - -   Hypertension: BP Readings from Last 3 Encounters:  09/27/18 124/82  09/21/17 104/70  09/17/16 110/70   Obesity: Wt Readings from Last 3 Encounters:  09/27/18 136 lb 12.8 oz (62.1 kg)  09/21/17 140 lb 1.6 oz (63.5 kg)  09/17/16 144 lb 8 oz (65.5 kg)   BMI Readings from Last 3 Encounters:  09/27/18 23.85 kg/m  09/21/17 23.68 kg/m  09/17/16 24.80 kg/m    Hep C Screening: today  STD testing and prevention (HIV/chl/gon/syphilis): N/A Intimate partner violence:negative screen  Sexual History/Pain during Intercourse: no pain or  bleeding with intercourse  Menstrual History/LMP/Abnormal Bleeding: s/p hysterectomy for benign reasons Incontinence Symptoms: none   Advanced Care Planning: A voluntary discussion about advance care planning including the explanation and discussion of advance directives.  Discussed health care proxy and Living will, and the patient was able to identify a health care proxy as Yvonne Young  - her sister.  Patient does not have a living will at present time.  Breast cancer:  HM Mammogram  Date Value Ref Range Status  07/30/2013 Normal  Final    BRCA gene screening: N/A Cervical cancer screening: s/p hysterectomy, not indicated  Osteoporosis Screening:   HM Dexa Scan  Date Value Ref Range Status  05/03/2012 Abnormal 0.4/-1.8/-13 Osteopenia on Femur  Final    Lipids:  Lab Results  Component Value Date   CHOL 231 (H) 09/21/2017   CHOL 217 (H) 09/17/2016   CHOL 207 (H) 07/01/2015   Lab Results  Component Value Date   HDL 53 09/21/2017   HDL 54 09/17/2016   HDL 63 07/01/2015   Lab Results  Component Value Date   LDLCALC 159 (H) 09/21/2017   LDLCALC 144 (H) 09/17/2016   LDLCALC 131 (H) 07/01/2015   Lab Results  Component Value Date   TRIG 87  09/21/2017   TRIG 93 09/17/2016   TRIG 67 07/01/2015   Lab Results  Component Value Date   CHOLHDL 4.4 09/21/2017   CHOLHDL 4.0 09/17/2016   CHOLHDL 3.3 07/01/2015   No results found for: LDLDIRECT  Glucose:  Glucose, Bld  Date Value Ref Range Status  09/17/2016 70 65 - 99 mg/dL Final    Skin cancer: discussed atypical lesions  Colorectal cancer: repeat in 2023  Lung cancer:  Low Dose CT Chest recommended if Age 51-80 years, 30 pack-year currently smoking OR have quit w/in 15years. Patient does not qualify.   [id number]   Patient Active Problem List   Diagnosis Date Noted  . Sinus bradycardia 09/21/2017  . Fibrocystic breast disease 07/01/2015  . History of iron deficiency 07/01/2015  . Allergic rhinitis, seasonal  07/01/2015  . Vitamin D deficiency 07/01/2015  . Seropositive rheumatoid arthritis ([place]) 03/11/2014    Past Surgical History:  Procedure Laterality Date  . ABDOMINAL HYSTERECTOMY  1999   w/o bilateral oophorectomy    Family History  Problem Relation Age of Onset  . Hypothyroidism Mother   . Hypertension Mother   . Breast cancer Neg Hx     Social History   Socioeconomic History  . Marital status: Single    Spouse name: Not on file  . Number of children: 1  . Years of education: 16  . Highest education level: High school graduate  Occupational History  . Occupation: Engineer, manufacturing systems: [employer]  . Occupation: Therapist, art    Employer: [employer]  . Financial resource strain: Not hard at all  . Food insecurity:    Worry: Never true    Inability: Never true  . Transportation needs:    Medical: No    Non-medical: No  Tobacco Use  . Smoking status: Never Smoker  . Smokeless tobacco: Never Used  Substance and Sexual Activity  . Alcohol use: No    Alcohol/week: 0.0 standard drinks  . Drug use: No  . Sexual activity: Yes    Partners: Male  Lifestyle  . Physical activity:    Days per week: 0 days    Minutes per session: 0 min  . Stress: Not at all  Relationships  . Social connections:    Talks on phone: Three times a week    Gets together: Once a week    Attends religious service: More than 4 times per year    Active member of club or organization: Yes    Attends meetings of clubs or organizations: Never    Relationship status: Widowed  . Intimate partner violence:    Fear of current or ex partner: No    Emotionally abused: No    Physically abused: No    Forced sexual activity: No  Other Topics Concern  . Not on file  Social History Narrative   Lives by herself, dating, she was married at age 61 and divorced at age 36 , re-married at age 31 and widow after one year.    She has two jobs     Current Outpatient  Medications:  .  Cholecalciferol (VITAMIN D3) 1000 units CAPS, Take by mouth., Disp: , Rfl:  .  etanercept (ENBREL SURECLICK) 50 MG/ML injection, Inject into the skin., Disp: , Rfl:  .  methotrexate (RHEUMATREX) 2.5 MG tablet, Take 4 tablets (10 mg total) by mouth once a week., Disp: 16 tablet, Rfl: 0  Allergies  Allergen Reactions  . Amoxicillin Nausea  Only    Had nausea for 3 days, and this stopped when she discontinued the medication.     ROS  Constitutional: Negative for fever or weight change.  Respiratory: Negative for cough and shortness of breath.   Cardiovascular: Negative for chest pain or palpitations.  Gastrointestinal: Negative for abdominal pain, no bowel changes.  Musculoskeletal: Negative for gait problem or joint swelling.  Skin: Negative for rash.  Neurological: Negative for dizziness or headache.  No other specific complaints in a complete review of systems (except as listed in HPI above).  Objective  Vitals:   09/27/18 0922  BP: 124/82  Pulse: 96  Resp: 16  Temp: 98 F (36.7 C)  TempSrc: Oral  SpO2: 98%  Weight: 136 lb 12.8 oz (62.1 kg)  Height: 5' 3.5" (1.613 m)    Body mass index is 23.85 kg/m.  Physical Exam  Constitutional: Patient appears well-developed and well-nourished. No distress.  HENT: Head: Normocephalic and atraumatic. Ears: B TMs ok, no erythema or effusion; Nose: Nose normal. Mouth/Throat: Oropharynx is clear and moist. No oropharyngeal exudate.  Eyes: Conjunctivae and EOM are normal. Pupils are equal, round, and reactive to light. No scleral icterus.  Neck: Normal range of motion. Neck supple. No JVD present. No thyromegaly present.  Cardiovascular: Normal rate, regular rhythm and normal heart sounds.  No murmur heard. No BLE edema. Pulmonary/Chest: Effort normal and breath sounds normal. No respiratory distress. Abdominal: Soft. Bowel sounds are normal, no distension. There is no tenderness. no masses Breast: no lumps or masses,  no nipple discharge or rashes FEMALE GENITALIA:  External genitalia normal External urethra mild prolapse  Vaginal vault normal with no discharge  Cervix absent Bimanual exam normal without masses RECTAL: not done Musculoskeletal: Normal range of motion, no joint effusions. No gross deformities Neurological: he is alert and oriented to person, place, and time. No cranial nerve deficit. Coordination, balance, strength, speech and gait are normal.  Skin: Skin is warm and dry. No rash noted. No erythema.  Psychiatric: Patient has a normal mood and affect. behavior is normal. Judgment and thought content normal.  PHQ2/9: Depression screen [facility] 2/9 09/27/2018 09/21/2017 09/17/2016 07/01/2015  Decreased Interest 0 0 0 0  Down, Depressed, Hopeless 0 0 0 0  PHQ - 2 Score 0 0 0 0  Altered sleeping 0 - - -  Tired, decreased energy 0 - - -  Change in appetite 0 - - -  Feeling bad or failure about yourself  0 - - -  Trouble concentrating 0 - - -  Moving slowly or fidgety/restless 0 - - -  Suicidal thoughts 0 - - -  PHQ-9 Score 0 - - -  Difficult doing work/chores Not difficult at all - - -     Fall Risk: Fall Risk  09/27/2018 09/21/2017 09/21/2017 09/17/2016 07/01/2015  Falls in the past year? 0 No No No No  Number falls in past yr: 0 - - - -  Injury with Fall? 0 - - - -  Follow up Falls evaluation completed - - - -     Assessment & Plan  1. Hyperglycemia  - Hemoglobin A1c - Glutamic acid decarboxylase auto abs  2. Vitamin D deficiency  - VITAMIN D 25 Hydroxy (Vit-D Deficiency, Fractures)  3. Breast cancer screening  Mammogram already scheduled   4. Seropositive rheumatoid arthritis ([place])  Reviewed labs done at [facility] and she is doing well   5. Well woman exam   6. Lipid screening  -  Lipid panel  7. Pre-diabetes  - Hemoglobin A1c  8. Osteopenia after menopause  - DG Bone Density; Future  9. Ovarian failure  - DG Bone Density; Future  10. Need for  hepatitis C screening test  - Hepatitis C Antibody  11. Need for vaccination for pneumococcus  - Pneumococcal polysaccharide vaccine 23-valent greater than or equal to 2yo subcutaneous/IM   -USPSTF grade A and B recommendations reviewed with patient; age-appropriate recommendations, preventive care, screening tests, etc discussed and encouraged; healthy living encouraged; see AVS for patient education given to patient -Discussed importance of 150 minutes of physical activity weekly, eat two servings of fish weekly, eat one serving of tree nuts ( cashews, pistachios, pecans, almonds.Marland Kitchen) every other day, eat 6 servings of fruit/vegetables daily and drink plenty of water and avoid sweet beverages.

## 2018-10-02 LAB — LIPID PANEL
Cholesterol: 239 mg/dL — ABNORMAL HIGH (ref <200)
HDL: 57 mg/dL (ref >=50)
LDL Cholesterol (Calc): 162 mg/dL (calc) — ABNORMAL HIGH
Non-HDL Cholesterol (Calc): 182 mg/dL (calc) — ABNORMAL HIGH (ref <130)
Total CHOL/HDL Ratio: 4.2 (calc) (ref <5.0)
Triglycerides: 94 mg/dL (ref <150)

## 2018-10-02 LAB — HEPATITIS C ANTIBODY
Hepatitis C Ab: NONREACTIVE
SIGNAL TO CUT-OFF: 0.03 (ref <1.00)

## 2018-10-02 LAB — HEMOGLOBIN A1C
Hgb A1c MFr Bld: 5.5 % of total Hgb (ref <5.7)
Mean Plasma Glucose: 111 (calc)
eAG (mmol/L): 6.2 (calc)

## 2018-10-02 LAB — GLUTAMIC ACID DECARBOXYLASE AUTO ABS: Glutamic Acid Decarb Ab: <5 IU/mL (ref <5)

## 2018-10-02 LAB — VITAMIN D 25 HYDROXY (VIT D DEFICIENCY, FRACTURES): Vit D, 25-Hydroxy: 29 ng/mL — ABNORMAL LOW (ref 30–100)

## 2018-11-08 ENCOUNTER — Other Ambulatory Visit: Payer: Self-pay

## 2018-11-08 ENCOUNTER — Ambulatory Visit
Admission: RE | Admit: 2018-11-08 | Discharge: 2018-11-08 | Disposition: A | Discharge status: Home / Self Care | Payer: BLUE CROSS/BLUE SHIELD | Source: Ambulatory Visit | Attending: Family Medicine | Admitting: Family Medicine

## 2018-11-08 DIAGNOSIS — Z1231 Encounter for screening mammogram for malignant neoplasm of breast: Secondary | ICD-10-CM | POA: Insufficient documentation

## 2019-02-14 ENCOUNTER — Other Ambulatory Visit: Payer: Self-pay

## 2019-02-14 ENCOUNTER — Ambulatory Visit (INDEPENDENT_AMBULATORY_CARE_PROVIDER_SITE_OTHER): Payer: BLUE CROSS/BLUE SHIELD

## 2019-02-14 DIAGNOSIS — Z23 Encounter for immunization: Secondary | ICD-10-CM

## 2019-03-14 DIAGNOSIS — M0579 Rheumatoid arthritis with rheumatoid factor of multiple sites without organ or systems involvement: Secondary | ICD-10-CM | POA: Diagnosis not present

## 2019-03-14 DIAGNOSIS — Z79899 Other long term (current) drug therapy: Secondary | ICD-10-CM | POA: Diagnosis not present

## 2019-06-06 DIAGNOSIS — M0579 Rheumatoid arthritis with rheumatoid factor of multiple sites without organ or systems involvement: Secondary | ICD-10-CM | POA: Diagnosis not present

## 2019-06-06 DIAGNOSIS — Z79899 Other long term (current) drug therapy: Secondary | ICD-10-CM | POA: Diagnosis not present

## 2019-06-18 DIAGNOSIS — M0579 Rheumatoid arthritis with rheumatoid factor of multiple sites without organ or systems involvement: Secondary | ICD-10-CM | POA: Diagnosis not present

## 2019-06-18 DIAGNOSIS — M79641 Pain in right hand: Secondary | ICD-10-CM | POA: Diagnosis not present

## 2019-06-18 DIAGNOSIS — Z79899 Other long term (current) drug therapy: Secondary | ICD-10-CM | POA: Diagnosis not present

## 2019-09-27 ENCOUNTER — Other Ambulatory Visit: Payer: Self-pay | Admitting: Family Medicine

## 2019-10-03 ENCOUNTER — Encounter: Payer: Self-pay | Admitting: Family Medicine

## 2019-10-03 ENCOUNTER — Other Ambulatory Visit: Payer: Self-pay

## 2019-10-03 ENCOUNTER — Other Ambulatory Visit (HOSPITAL_COMMUNITY)
Admission: RE | Admit: 2019-10-03 | Discharge: 2019-10-03 | Disposition: A | Discharge status: Home / Self Care | Payer: BC Managed Care – PPO | Source: Ambulatory Visit | Attending: Family Medicine | Admitting: Family Medicine

## 2019-10-03 ENCOUNTER — Ambulatory Visit (INDEPENDENT_AMBULATORY_CARE_PROVIDER_SITE_OTHER): Payer: BC Managed Care – PPO | Admitting: Family Medicine

## 2019-10-03 VITALS — BP 110/80 | HR 77 | Temp 97.3°F | Resp 16 | Ht 63.5 in | Wt 140.8 lb

## 2019-10-03 DIAGNOSIS — Z113 Encounter for screening for infections with a predominantly sexual mode of transmission: Secondary | ICD-10-CM

## 2019-10-03 DIAGNOSIS — R739 Hyperglycemia, unspecified: Secondary | ICD-10-CM

## 2019-10-03 DIAGNOSIS — Z1231 Encounter for screening mammogram for malignant neoplasm of breast: Secondary | ICD-10-CM

## 2019-10-03 DIAGNOSIS — Z8639 Personal history of other endocrine, nutritional and metabolic disease: Secondary | ICD-10-CM

## 2019-10-03 DIAGNOSIS — R7303 Prediabetes: Secondary | ICD-10-CM

## 2019-10-03 DIAGNOSIS — Z13220 Encounter for screening for lipoid disorders: Secondary | ICD-10-CM | POA: Diagnosis not present

## 2019-10-03 DIAGNOSIS — M8580 Other specified disorders of bone density and structure, unspecified site: Secondary | ICD-10-CM

## 2019-10-03 DIAGNOSIS — Z780 Asymptomatic menopausal state: Secondary | ICD-10-CM

## 2019-10-03 DIAGNOSIS — E559 Vitamin D deficiency, unspecified: Secondary | ICD-10-CM

## 2019-10-03 DIAGNOSIS — N368 Other specified disorders of urethra: Secondary | ICD-10-CM

## 2019-10-03 DIAGNOSIS — M059 Rheumatoid arthritis with rheumatoid factor, unspecified: Secondary | ICD-10-CM | POA: Diagnosis not present

## 2019-10-03 DIAGNOSIS — Z23 Encounter for immunization: Secondary | ICD-10-CM

## 2019-10-03 DIAGNOSIS — Z0000 Encounter for general adult medical examination without abnormal findings: Secondary | ICD-10-CM

## 2019-10-03 MED ORDER — PREMARIN 0.625 MG/GM VA CREA
1.0000 | TOPICAL_CREAM | Freq: Every day | VAGINAL | 12 refills | Status: DC
Start: 2019-10-03 — End: 2020-08-11

## 2019-10-03 NOTE — Patient Instructions (Signed)

## 2019-10-03 NOTE — Progress Notes (Signed)
Name: Yvonne Young   MRN: [id number]    DOB: 10-08-1960   Date:10/03/2019       Progress Note  Subjective  Chief Complaint  Chief Complaint  Patient presents with  . Annual Exam    HPI  Patient presents for annual CPE and follow up  RA: seeing Dr. Jefm Bryant once a year, last visit 06/2019, last sed was normal 06/2019. She is still on Enbrel weekly and also methotrexate 4 tablets weekly - down from 10 tablets in the past. She states still has some stiffness during the day when typing at work, no swelling or redness and no pain. She is doing well and appointment are now once a year Still going every three months for labs    Pre-diabetes: HgbA1C was 5.8% in 2019 but down to 5.5% in 2020, we will recheck levels today.  She denies polyphagia, polydipsia or polyuria. She has normal glutamic acid decarboxylase auto antibodies.   Osteopenia: on bone density done in 2013, she is status post hysterectomy and advised to recheck it this year , she has a good intake of calcium, she is taking vitamin D daily   Diet: balanced diet  Exercise: needs to increase to 150 minutes per week   USPSTF grade A and B recommendations    Office Visit from 10/03/2019 in [facility]  AUDIT-C Score  0     Depression: Phq 9 is  negative Depression screen [facility] 2/9 10/03/2019 10/03/2019 09/27/2018 09/21/2017 09/17/2016  Decreased Interest 0 0 0 0 0  Down, Depressed, Hopeless 0 0 0 0 0  PHQ - 2 Score 0 0 0 0 0  Altered sleeping 0 0 0 - -  Tired, decreased energy 0 0 0 - -  Change in appetite 0 0 0 - -  Feeling bad or failure about yourself  0 0 0 - -  Trouble concentrating 0 0 0 - -  Moving slowly or fidgety/restless 0 0 0 - -  Suicidal thoughts 0 0 0 - -  PHQ-9 Score 0 0 0 - -  Difficult doing work/chores Not difficult at all - Not difficult at all - -   Hypertension: BP Readings from Last 3 Encounters:  10/03/19 110/80  09/27/18 124/82  09/21/17 104/70   Obesity: Wt Readings from Last 3  Encounters:  10/03/19 140 lb 12.8 oz (63.9 kg)  09/27/18 136 lb 12.8 oz (62.1 kg)  09/21/17 140 lb 1.6 oz (63.5 kg)   BMI Readings from Last 3 Encounters:  10/03/19 24.55 kg/m  09/27/18 23.85 kg/m  09/21/17 23.68 kg/m     Hep C Screening: 2020  STD testing and prevention (HIV/chl/gon/syphilis): today  Intimate partner violence: Negative screen  Sexual History (Partners/Practices/Protection from [employer] hx STI/Pregnancy Plans): one partner in the past year, not wearing condoms  Pain during Intercourse: no pain , no discharge  Menstrual History/LMP/Abnormal Bleeding: s/p hysterectomy for benign cause Incontinence Symptoms: none   Breast cancer:  - Last Mammogram: 11/08/2018 - BRCA gene screening: N/A  Osteoporosis: Discussed high calcium and vitamin D supplementation, weight bearing exercises  Cervical cancer screening: N/A  Skin cancer: Discussed monitoring for atypical lesions  Colorectal cancer: repeat in 2023  Lung cancer:  Low Dose CT Chest recommended if Age 30-80 years, 30 pack-year currently smoking OR have quit w/in 15years. Patient does not qualify.   ECG: 2019   Advanced Care Planning: A voluntary discussion about advance care planning including the explanation and discussion of advance directives.  Discussed  health care proxy and Living will, and the patient was able to identify a health care proxy as [name] - sister   Patient does not have a living will at present time.   Lipids: Lab Results  Component Value Date   CHOL 239 (H) 09/27/2018   CHOL 231 (H) 09/21/2017   CHOL 217 (H) 09/17/2016   Lab Results  Component Value Date   HDL 57 09/27/2018   HDL 53 09/21/2017   HDL 54 09/17/2016   Lab Results  Component Value Date   LDLCALC 162 (H) 09/27/2018   LDLCALC 159 (H) 09/21/2017   LDLCALC 144 (H) 09/17/2016   Lab Results  Component Value Date   TRIG 94 09/27/2018   TRIG 87 09/21/2017   TRIG 93 09/17/2016   Lab Results  Component Value Date    CHOLHDL 4.2 09/27/2018   CHOLHDL 4.4 09/21/2017   CHOLHDL 4.0 09/17/2016   No results found for: LDLDIRECT  Glucose: Glucose, Bld  Date Value Ref Range Status  09/17/2016 70 65 - 99 mg/dL Final    Patient Active Problem List   Diagnosis Date Noted  . Sinus bradycardia 09/21/2017  . Fibrocystic breast disease 07/01/2015  . History of iron deficiency 07/01/2015  . Allergic rhinitis, seasonal 07/01/2015  . Vitamin D deficiency 07/01/2015    Past Surgical History:  Procedure Laterality Date  . ABDOMINAL HYSTERECTOMY  1999   w/o bilateral oophorectomy    Family History  Problem Relation Age of Onset  . Hypothyroidism Mother   . Hypertension Mother   . Breast cancer Neg Hx     Social History   Socioeconomic History  . Marital status: Single    Spouse name: Not on file  . Number of children: 1  . Years of education: 68  . Highest education level: High school graduate  Occupational History  . Occupation: Engineer, manufacturing systems: [employer]  . Occupation: Therapist, art    Employer: [id number]  Tobacco Use  . Smoking status: Never Smoker  . Smokeless tobacco: Never Used  Substance and Sexual Activity  . Alcohol use: No    Alcohol/week: 0.0 standard drinks  . Drug use: No  . Sexual activity: Yes    Partners: Male  Other Topics Concern  . Not on file  Social History Narrative   Lives by herself, dating, she was married at age 41 and divorced at age 25 , re-married at age 10 and widow after one year.    She has two jobs - works for [employer] ( remote ) and also at [employer]   . Difficulty of Paying Living Expenses: Not hard at all  Food Insecurity: No Food Insecurity  . Worried About Charity fundraiser in the Last Year: Never true  . Ran Out of Food in the Last Year: Never true  Transportation Needs: No Transportation Needs  . Lack of Transportation (Medical):  No  . Lack of Transportation (Non-Medical): No  Physical Activity: Insufficiently Active  . Days of Exercise per Week: 2 days  . Minutes of Exercise per Session: 30 min  Stress: No Stress Concern Present  . Feeling of Stress : Not at all  Social Connections: Slightly Isolated  . Frequency of Communication with Friends and Family: More than three times a week  . Frequency of Social Gatherings with Friends and Family: More than three times a week  .  Attends Religious Services: More than 4 times per year  . Active Member of Clubs or Organizations: Yes  . Attends Archivist Meetings: More than 4 times per year  . Marital Status: Divorced  Human resources officer Violence: Not At Risk  . Fear of Current or Ex-Partner: No  . Emotionally Abused: No  . Physically Abused: No  . Sexually Abused: No     Current Outpatient Medications:  .  Cholecalciferol (VITAMIN D3) 1000 units CAPS, Take by mouth., Disp: , Rfl:  .  etanercept (ENBREL SURECLICK) 50 MG/ML injection, Inject into the skin., Disp: , Rfl:  .  methotrexate (RHEUMATREX) 2.5 MG tablet, Take 4 tablets (10 mg total) by mouth once a week., Disp: 16 tablet, Rfl: 0  Allergies  Allergen Reactions  . Amoxicillin Nausea Only    Had nausea for 3 days, and this stopped when she discontinued the medication.     ROS  Constitutional: Negative for fever or weight change.  Respiratory: Negative for cough and shortness of breath.   Cardiovascular: Negative for chest pain or palpitations.  Gastrointestinal: Negative for abdominal pain, no bowel changes.  Musculoskeletal: Negative for gait problem or joint swelling.  Skin: Negative for rash.  Neurological: Negative for dizziness or headache.  No other specific complaints in a complete review of systems (except as listed in HPI above).  Objective  Vitals:   10/03/19 0746  BP: 110/80  Pulse: 77  Resp: 16  Temp: (!) 97.3 F (36.3 C)  TempSrc: Temporal  SpO2: 98%  Weight: 140 lb  12.8 oz (63.9 kg)  Height: 5' 3.5" (1.613 m)    Body mass index is 24.55 kg/m.  Physical Exam  Constitutional: Patient appears well-developed and well-nourished. No distress.  HENT: Head: Normocephalic and atraumatic. Ears: B TMs ok, no erythema or effusion; Nose: not done Mouth/Throat: not done Eyes: Conjunctivae and EOM are normal. Pupils are equal, round, and reactive to light. No scleral icterus.  Neck: Normal range of motion. Neck supple. No JVD present. No thyromegaly present.  Cardiovascular: Normal rate, regular rhythm and normal heart sounds.  No murmur heard. No BLE edema. Pulmonary/Chest: Effort normal and breath sounds normal. No respiratory distress. Abdominal: Soft. Bowel sounds are normal, no distension. There is no tenderness. no masses Breast: no lumps or masses, no nipple discharge or rashes FEMALE GENITALIA:  External genitalia normal  External urethra showed mild prolapse Vaginal vault normal without discharge or lesions Cervix absent  Bimanual exam normal without masses  RECTAL: not done Musculoskeletal: Normal range of motion, no joint effusions. No gross deformities Neurological: he is alert and oriented to person, place, and time. No cranial nerve deficit. Coordination, balance, strength, speech and gait are normal.  Skin: Skin is warm and dry. No rash noted. No erythema.  Psychiatric: Patient has a normal mood and affect. behavior is normal. Judgment and thought content normal.  Fall Risk: Fall Risk  10/03/2019 09/27/2018 09/21/2017 09/21/2017 09/17/2016  Falls in the past year? 0 0 No No No  Number falls in past yr: 0 0 - - -  Injury with Fall? 0 0 - - -  Follow up - Falls evaluation completed - - -     Functional Status Survey: Is the patient deaf or have difficulty hearing?: No Does the patient have difficulty seeing, even when wearing glasses/contacts?: No Does the patient have difficulty concentrating, remembering, or making decisions?: No Does the  patient have difficulty walking or climbing stairs?: No Does the patient have difficulty  dressing or bathing?: No Does the patient have difficulty doing errands alone such as visiting a doctor's office or shopping?: No  Assessment & Plan  1. Hyperglycemia  - Hemoglobin A1c  2. Vitamin D deficiency  - VITAMIN D 25 Hydroxy (Vit-D Deficiency, Fractures)  3. Seropositive rheumatoid arthritis ([place])  Keep follow up with rheumatologist  4. Pre-diabetes  - Hemoglobin A1c  5. Osteopenia after menopause  - DG Bone Density; Future  6. Lipid screening  - Lipid panel  7. Encounter for screening mammogram for malignant neoplasm of breast  - CBC with Differential/Platelet - COMPLETE METABOLIC PANEL WITH GFR - MM 3D SCREEN BREAST BILATERAL; Future  8. History of iron deficiency  - CBC with Differential/Platelet  9. Routine screening for STI (sexually transmitted infection)   - Urine cytology ancillary only - RPR - HIV Antibody (routine testing w rflx)   10. Need for shingles vaccine  She will call insurance to find out about coverage first   -USPSTF grade A and B recommendations reviewed with patient; age-appropriate recommendations, preventive care, screening tests, etc discussed and encouraged; healthy living encouraged; see AVS for patient education given to patient -Discussed importance of 150 minutes of physical activity weekly, eat two servings of fish weekly, eat one serving of tree nuts ( cashews, pistachios, pecans, almonds.Marland Kitchen) every other day, eat 6 servings of fruit/vegetables daily and drink plenty of water and avoid sweet beverages.

## 2019-10-04 LAB — CBC WITH DIFFERENTIAL/PLATELET
Absolute Monocytes: 496 cells/uL (ref 200–950)
Basophils Absolute: 37 cells/uL (ref 0–200)
Basophils Relative: 0.6 %
Eosinophils Absolute: 50 cells/uL (ref 15–500)
Eosinophils Relative: 0.8 %
HCT: 41.1 % (ref 35.0–45.0)
Hemoglobin: 13.7 g/dL (ref 11.7–15.5)
Lymphs Abs: 2145 cells/uL (ref 850–3900)
MCH: 29.7 pg (ref 27.0–33.0)
MCHC: 33.3 g/dL (ref 32.0–36.0)
MCV: 89.2 fL (ref 80.0–100.0)
MPV: 11 fL (ref 7.5–12.5)
Monocytes Relative: 8 %
Neutro Abs: 3472 cells/uL (ref 1500–7800)
Neutrophils Relative %: 56 %
Platelets: 206 10*3/uL (ref 140–400)
RBC: 4.61 10*6/uL (ref 3.80–5.10)
RDW: 13.4 % (ref 11.0–15.0)
Total Lymphocyte: 34.6 %
WBC: 6.2 10*3/uL (ref 3.8–10.8)

## 2019-10-04 LAB — LIPID PANEL
Cholesterol: 239 mg/dL — ABNORMAL HIGH (ref <200)
HDL: 53 mg/dL (ref >=50)
LDL Cholesterol (Calc): 167 mg/dL (calc) — ABNORMAL HIGH
Non-HDL Cholesterol (Calc): 186 mg/dL (calc) — ABNORMAL HIGH (ref <130)
Total CHOL/HDL Ratio: 4.5 (calc) (ref <5.0)
Triglycerides: 85 mg/dL (ref <150)

## 2019-10-04 LAB — COMPLETE METABOLIC PANEL WITH GFR
AG Ratio: 1.3 (calc) (ref 1.0–2.5)
ALT: 14 U/L (ref 6–29)
AST: 18 U/L (ref 10–35)
Albumin: 4.4 g/dL (ref 3.6–5.1)
Alkaline phosphatase (APISO): 70 U/L (ref 37–153)
BUN: 12 mg/dL (ref 7–25)
CO2: 29 mmol/L (ref 20–32)
Calcium: 9.6 mg/dL (ref 8.6–10.4)
Chloride: 106 mmol/L (ref 98–110)
Creat: 0.77 mg/dL (ref 0.50–1.05)
GFR, Est African American: 99 mL/min/{1.73_m2} (ref >=60)
GFR, Est Non African American: 85 mL/min/{1.73_m2} (ref >=60)
Globulin: 3.3 g/dL (calc) (ref 1.9–3.7)
Glucose, Bld: 87 mg/dL (ref 65–99)
Potassium: 4.4 mmol/L (ref 3.5–5.3)
Sodium: 141 mmol/L (ref 135–146)
Total Bilirubin: 0.4 mg/dL (ref 0.2–1.2)
Total Protein: 7.7 g/dL (ref 6.1–8.1)

## 2019-10-04 LAB — HEMOGLOBIN A1C
Hgb A1c MFr Bld: 5.4 % of total Hgb (ref <5.7)
Mean Plasma Glucose: 108 (calc)
eAG (mmol/L): 6 (calc)

## 2019-10-04 LAB — HIV ANTIBODY (ROUTINE TESTING W REFLEX): HIV 1&2 Ab, 4th Generation: NONREACTIVE

## 2019-10-04 LAB — URINE CYTOLOGY ANCILLARY ONLY
Chlamydia: NEGATIVE
Comment: NEGATIVE
Comment: NORMAL
Neisseria Gonorrhea: NEGATIVE

## 2019-10-04 LAB — VITAMIN D 25 HYDROXY (VIT D DEFICIENCY, FRACTURES): Vit D, 25-Hydroxy: 26 ng/mL — ABNORMAL LOW (ref 30–100)

## 2019-10-04 LAB — RPR: RPR Ser Ql: NONREACTIVE

## 2019-10-11 ENCOUNTER — Ambulatory Visit (INDEPENDENT_AMBULATORY_CARE_PROVIDER_SITE_OTHER): Payer: BC Managed Care – PPO

## 2019-10-11 ENCOUNTER — Other Ambulatory Visit: Payer: Self-pay

## 2019-10-11 DIAGNOSIS — Z23 Encounter for immunization: Secondary | ICD-10-CM | POA: Diagnosis not present

## 2019-10-11 DIAGNOSIS — Z0000 Encounter for general adult medical examination without abnormal findings: Secondary | ICD-10-CM | POA: Diagnosis not present

## 2019-10-17 DIAGNOSIS — Z79899 Other long term (current) drug therapy: Secondary | ICD-10-CM | POA: Diagnosis not present

## 2019-10-17 DIAGNOSIS — M0579 Rheumatoid arthritis with rheumatoid factor of multiple sites without organ or systems involvement: Secondary | ICD-10-CM | POA: Diagnosis not present

## 2019-11-14 ENCOUNTER — Ambulatory Visit
Admission: RE | Admit: 2019-11-14 | Discharge: 2019-11-14 | Disposition: A | Discharge status: Home / Self Care | Payer: BC Managed Care – PPO | Source: Ambulatory Visit | Attending: Family Medicine | Admitting: Family Medicine

## 2019-11-14 DIAGNOSIS — M8589 Other specified disorders of bone density and structure, multiple sites: Secondary | ICD-10-CM | POA: Diagnosis not present

## 2019-11-14 DIAGNOSIS — Z1231 Encounter for screening mammogram for malignant neoplasm of breast: Secondary | ICD-10-CM | POA: Insufficient documentation

## 2019-11-14 DIAGNOSIS — M8580 Other specified disorders of bone density and structure, unspecified site: Secondary | ICD-10-CM

## 2019-11-14 DIAGNOSIS — Z780 Asymptomatic menopausal state: Secondary | ICD-10-CM

## 2020-01-16 ENCOUNTER — Other Ambulatory Visit: Payer: Self-pay

## 2020-01-16 ENCOUNTER — Ambulatory Visit (INDEPENDENT_AMBULATORY_CARE_PROVIDER_SITE_OTHER): Payer: BC Managed Care – PPO

## 2020-01-16 DIAGNOSIS — Z23 Encounter for immunization: Secondary | ICD-10-CM

## 2020-02-13 DIAGNOSIS — M0579 Rheumatoid arthritis with rheumatoid factor of multiple sites without organ or systems involvement: Secondary | ICD-10-CM | POA: Diagnosis not present

## 2020-02-13 DIAGNOSIS — Z79899 Other long term (current) drug therapy: Secondary | ICD-10-CM | POA: Diagnosis not present

## 2020-03-25 ENCOUNTER — Other Ambulatory Visit: Payer: Self-pay

## 2020-03-25 ENCOUNTER — Ambulatory Visit (INDEPENDENT_AMBULATORY_CARE_PROVIDER_SITE_OTHER): Payer: BC Managed Care – PPO

## 2020-03-25 DIAGNOSIS — Z23 Encounter for immunization: Secondary | ICD-10-CM | POA: Diagnosis not present

## 2020-06-11 DIAGNOSIS — M0579 Rheumatoid arthritis with rheumatoid factor of multiple sites without organ or systems involvement: Secondary | ICD-10-CM | POA: Diagnosis not present

## 2020-06-11 DIAGNOSIS — Z79899 Other long term (current) drug therapy: Secondary | ICD-10-CM | POA: Diagnosis not present

## 2020-06-17 DIAGNOSIS — M79641 Pain in right hand: Secondary | ICD-10-CM | POA: Diagnosis not present

## 2020-06-17 DIAGNOSIS — M0579 Rheumatoid arthritis with rheumatoid factor of multiple sites without organ or systems involvement: Secondary | ICD-10-CM | POA: Diagnosis not present

## 2020-06-17 DIAGNOSIS — Z79899 Other long term (current) drug therapy: Secondary | ICD-10-CM | POA: Diagnosis not present

## 2020-08-07 ENCOUNTER — Telehealth: Payer: Self-pay | Admitting: Family Medicine

## 2020-08-07 NOTE — Telephone Encounter (Addendum)
Pt is calling and per triage nurse send message to office . Pt has a bump on her right shoulder that has increase in size and hard x 1 wk with soreness. Please advice dr Carlynn Purl next available appt is 08-22-2020

## 2020-08-07 NOTE — Telephone Encounter (Signed)
appt scheduled with Dr Carlynn Purl

## 2020-08-11 NOTE — Progress Notes (Signed)
Name: Yvonne Young   MRN: [id number]    DOB: 22-Sep-1960   Date:08/12/2020       Progress Note  Subjective  Chief Complaint  Acute: bump on shoulder  HPI  Cyst : she noticed a small bump on top of right shoulder area, initially not tender but over the past 10 days it has been bothering her, getting larger and tender to touch or even when clothes get over the area. No redness, no fever, chills or change in appetite  RA: still feels stiff at times, but pain has been under control, she is compliant with medications and visits to rheumatologist. On Enbrel and Methotrexate and up to date with vaccinations   Patient Active Problem List   Diagnosis Date Noted  . Sinus bradycardia 09/21/2017  . Fibrocystic breast disease 07/01/2015  . History of iron deficiency 07/01/2015  . Allergic rhinitis, seasonal 07/01/2015  . Vitamin D deficiency 07/01/2015    Past Surgical History:  Procedure Laterality Date  . ABDOMINAL HYSTERECTOMY  1999   w/o bilateral oophorectomy    Family History  Problem Relation Age of Onset  . Hypothyroidism Mother   . Hypertension Mother   . Breast cancer Neg Hx     Social History   Tobacco Use  . Smoking status: Never Smoker  . Smokeless tobacco: Never Used  Substance Use Topics  . Alcohol use: No    Alcohol/week: 0.0 standard drinks     Current Outpatient Medications:  .  Cholecalciferol (VITAMIN D3) 1000 units CAPS, Take by mouth., Disp: , Rfl:  .  etanercept (ENBREL SURECLICK) 50 MG/ML injection, Inject into the skin., Disp: , Rfl:  .  methotrexate (RHEUMATREX) 2.5 MG tablet, Take 4 tablets (10 mg total) by mouth once a week., Disp: 16 tablet, Rfl: 0  Allergies  Allergen Reactions  . Amoxicillin Nausea Only    Had nausea for 3 days, and this stopped when she discontinued the medication.    I personally reviewed active problem list, medication list, allergies, family history, social history, health maintenance with the patient/caregiver  today.   ROS  Ten systems reviewed and is negative except as mentioned in HPI  Objective  Vitals:   08/12/20 1510  BP: 112/78  Pulse: 93  Resp: 16  Temp: 98 F (36.7 C)  TempSrc: Oral  SpO2: 99%  Weight: 141 lb 8 oz (64.2 kg)  Height: 5\' 4"  (1.626 m)    Body mass index is 24.29 kg/m.  Physical Exam  Constitutional: Patient appears well-developed and well-nourished.  No distress.  HEENT: head atraumatic, normocephalic, pupils equal and reactive to light,neck supple Cardiovascular: Normal rate, regular rhythm and normal heart sounds.  No murmur heard. No BLE edema. Pulmonary/Chest: Effort normal and breath sounds normal. No respiratory distress. Abdominal: Soft.  There is no tenderness. Psychiatric: Patient has a normal mood and affect. behavior is normal. Judgment and thought content normal. Skin: sebaceous cyst on trapezium area right side, slightly tender to touch, but no redness, drainage or increase in warmth   PHQ2/9: Depression screen [facility] 2/9 08/12/2020 10/03/2019 10/03/2019 09/27/2018 09/21/2017  Decreased Interest 0 0 0 0 0  Down, Depressed, Hopeless 0 0 0 0 0  PHQ - 2 Score 0 0 0 0 0  Altered sleeping - 0 0 0 -  Tired, decreased energy - 0 0 0 -  Change in appetite - 0 0 0 -  Feeling bad or failure about yourself  - 0 0 0 -  Trouble concentrating -  0 0 0 -  Moving slowly or fidgety/restless - 0 0 0 -  Suicidal thoughts - 0 0 0 -  PHQ-9 Score - 0 0 0 -  Difficult doing work/chores - Not difficult at all - Not difficult at all -    phq 9 is negative   Fall Risk: Fall Risk  08/12/2020 10/03/2019 09/27/2018 09/21/2017 09/21/2017  Falls in the past year? 0 0 0 No No  Number falls in past yr: 0 0 0 - -  Injury with Fall? 0 0 0 - -  Follow up Falls prevention discussed - Falls evaluation completed - -     Functional Status Survey: Is the patient deaf or have difficulty hearing?: No Does the patient have difficulty seeing, even when wearing glasses/contacts?:  No Does the patient have difficulty concentrating, remembering, or making decisions?: No Does the patient have difficulty walking or climbing stairs?: No Does the patient have difficulty dressing or bathing?: No Does the patient have difficulty doing errands alone such as visiting a doctor's office or shopping?: No   Assessment & Plan  1. Cyst of skin  - Ambulatory referral to General Surgery   2. Seropositive rheumatoid arthritis (HCC)

## 2020-08-12 ENCOUNTER — Encounter: Payer: Self-pay | Admitting: Family Medicine

## 2020-08-12 ENCOUNTER — Ambulatory Visit: Payer: BC Managed Care – PPO | Admitting: Family Medicine

## 2020-08-12 ENCOUNTER — Other Ambulatory Visit: Payer: Self-pay

## 2020-08-12 VITALS — BP 112/78 | HR 93 | Temp 98.0°F | Resp 16 | Ht 64.0 in | Wt 141.5 lb

## 2020-08-12 DIAGNOSIS — M059 Rheumatoid arthritis with rheumatoid factor, unspecified: Secondary | ICD-10-CM | POA: Diagnosis not present

## 2020-08-12 DIAGNOSIS — L729 Follicular cyst of the skin and subcutaneous tissue, unspecified: Secondary | ICD-10-CM | POA: Diagnosis not present

## 2020-08-21 ENCOUNTER — Other Ambulatory Visit: Payer: Self-pay

## 2020-08-21 ENCOUNTER — Ambulatory Visit: Payer: BC Managed Care – PPO | Admitting: General Surgery

## 2020-08-21 ENCOUNTER — Encounter: Payer: Self-pay | Admitting: General Surgery

## 2020-08-21 VITALS — BP 119/81 | HR 69 | Temp 98.0°F | Ht 64.0 in | Wt 140.6 lb

## 2020-08-21 DIAGNOSIS — L723 Sebaceous cyst: Secondary | ICD-10-CM | POA: Diagnosis not present

## 2020-08-21 NOTE — Patient Instructions (Addendum)
Please call with any questions or concerns.   Epidermoid Cyst Removal, Care After This sheet gives you information about how to care for yourself after your procedure. Your health care provider may also give you more specific instructions. If you have problems or questions, contact your health care provider. What can I expect after the procedure? After the procedure, it is common to have:  Soreness in the area where your cyst was removed.  Tightness or itchiness from the stitches (sutures) in your skin. Follow these instructions at home: Medicines  Take over-the-counter and prescription medicines only as told by your health care provider.  If you were prescribed an antibiotic medicine or ointment, take or apply it as told by your health care provider. Do not stop using the antibiotic even if you start to feel better. Incision care  Follow instructions from your health care provider about how to take care of your incision. Make sure you: ? Wash your hands with soap and water for at least 20 seconds before you change your bandage (dressing). If soap and water are not available, use hand sanitizer. ? Change your dressing as told by your health care provider. ? Leave sutures, skin glue, or adhesive strips in place. These skin closures may need to stay in place for 1-2 weeks or longer. If adhesive strip edges start to loosen and curl up, you may trim the loose edges. Do not remove adhesive strips completely unless your health care provider tells you to do that.  Keep the dressing dry until your health care provider says that it can be removed.  After your dressing is off, check your incision area every day for signs of infection. Check for: ? Redness, swelling, or pain. ? Fluid or blood. ? Warmth. ? Pus or a bad smell.   General instructions  Do not take baths, swim, or use a hot tub until your health care provider approves. Ask your health care provider if you may take showers. You may  only be allowed to take sponge baths.  Your health care provider may ask you to avoid contact sports or activities that take a lot of effort. Do not do anything that stretches or puts pressure on your incision.  You can return to your normal diet.  Keep all follow-up visits. This is important. Contact a health care provider if:  You have a fever.  You have redness, swelling, or pain in the incision area.  You have fluid or blood coming from your incision.  You have pus or a bad smell coming from your incision.  Your incision feels warm to the touch.  Your cyst grows back. Get help right away if:  If the incision site suddenly increases in size and you have pain at the incision site. You may be checked for a collection of blood under the skin from the procedure (hematoma). Summary  After the procedure, it is common to have soreness in the area where your cyst was removed.  Take or apply over-the-counter and prescription medicines only as told by your health care provider.  Follow instructions from your health care provider about how to take care of your incision. This information is not intended to replace advice given to you by your health care provider. Make sure you discuss any questions you have with your health care provider. Document Revised: 08/22/2019 Document Reviewed: 08/22/2019 Elsevier Patient Education  2021 [employer].

## 2020-08-21 NOTE — Progress Notes (Signed)
Patient ID: Yvonne Young, female   DOB: 01/12/1961, 60 y.o.   MRN: [id number]  Chief Complaint  Patient presents with  . New Patient (Initial Visit)    Right shoulders cyst     HPI Yvonne Young is a 60 y.o. female.  She has been referred by her primary care provider, Dr. Carlynn Purl, for further evaluation of a cyst on her right shoulder.  Yvonne Young states that she first noticed a small lump about 2 weeks ago on her right shoulder.  Initially, it was sensitive to touch and to the feeling of her clothes pressing over it.  It has enlarged somewhat since she first noticed it.  She denies any fevers or chills.  The lesion has never been hot or red, nor has it had any drainage.  She has never had any similar lesions anywhere else on her body.  She states that it is now not as sensitive as it was initially.   Past Medical History:  Diagnosis Date  . AR (allergic rhinitis)   . Fibrocystic breast   . Osteopenia   . Postsurgical menopause   . RA (rheumatoid arthritis) (HCC)    Dr. Gavin Potters  . Rheumatoid arthritis (HCC)   . Vitamin D deficiency     Past Surgical History:  Procedure Laterality Date  . ABDOMINAL HYSTERECTOMY  1999   w/o bilateral oophorectomy    Family History  Problem Relation Age of Onset  . Hypothyroidism Mother   . Hypertension Mother   . Breast cancer Neg Hx     Social History Social History   Tobacco Use  . Smoking status: Never Smoker  . Smokeless tobacco: Never Used  Vaping Use  . Vaping Use: Never used  Substance Use Topics  . Alcohol use: No    Alcohol/week: 0.0 standard drinks  . Drug use: No    Allergies  Allergen Reactions  . Amoxicillin Nausea Only    Had nausea for 3 days, and this stopped when she discontinued the medication.    Current Outpatient Medications  Medication Sig Dispense Refill  . Cholecalciferol (VITAMIN D3) 1000 units CAPS Take by mouth.    . etanercept (ENBREL) 50 MG/ML injection Inject into the skin.    .  methotrexate (RHEUMATREX) 2.5 MG tablet Take 4 tablets (10 mg total) by mouth once a week. 16 tablet 0   No current facility-administered medications for this visit.    Review of Systems Review of Systems  All other systems reviewed and are negative. Or as discussed in the history of present illness.  Blood pressure 119/81, pulse 69, temperature 98 F (36.7 C), temperature source Oral, height 5\' 4"  (1.626 m), weight 140 lb 9.6 oz (63.8 kg). Body mass index is 24.13 kg/m.  Physical Exam Physical Exam Constitutional:      General: She is not in acute distress.    Appearance: Normal appearance. She is normal weight.  HENT:     Head: Normocephalic and atraumatic.     Nose:     Comments: Covered with a mask    Mouth/Throat:     Comments: Covered with a mask Eyes:     General: No scleral icterus.       Right eye: No discharge.        Left eye: No discharge.     Conjunctiva/sclera: Conjunctivae normal.     Comments: Wearing glasses  Neck:     Comments: The trachea is midline.  There is no palpable cervical or supraclavicular  lymphadenopathy.  No thyromegaly or dominant thyroid masses appreciated.  The gland moves freely with deglutition. Cardiovascular:     Rate and Rhythm: Normal rate and regular rhythm.     Pulses: Normal pulses.  Pulmonary:     Effort: Pulmonary effort is normal.     Breath sounds: Normal breath sounds.  Abdominal:     General: Bowel sounds are normal.     Palpations: Abdomen is soft.  Genitourinary:    Comments: Deferred Musculoskeletal:        General: No swelling or tenderness.     Right lower leg: No edema.     Left lower leg: No edema.  Skin:    General: Skin is warm and dry.          Comments: There is a roughly 1.5 cm raised area on the patient's right upper trapezius.  It is not red, hot to the touch, nor is there any drainage present.  There is a central pore visible.  Neurological:     General: No focal deficit present.     Mental Status:  She is alert and oriented to person, place, and time.  Psychiatric:        Mood and Affect: Mood normal.        Behavior: Behavior normal.     Data Reviewed I reviewed Dr. Carlynn Purl note from August 12, 2020.  This visit was to evaluate the bump on the patient's shoulder.  Dr. Carlynn Purl evaluated the lesion and felt it most likely represented a sebaceous cyst.  She was referred to general surgery based upon this finding.  Assessment This is a 60 year old woman with a small sebaceous cyst on her right trapezium.  She is interested in having it removed, as she feels like she is constantly touching it.  Plan We will schedule her for an office procedure next week to excise the sebaceous cyst.  I discussed the risks of the procedure with her.  These include, but are not limited to, bleeding, infection, wound healing problems, scarring, pain, recurrence.  She would like to proceed and we will see her next week in clinic.    Duanne Guess 08/21/2020, 2:39 PM

## 2020-08-28 ENCOUNTER — Ambulatory Visit: Payer: BC Managed Care – PPO | Admitting: General Surgery

## 2020-08-28 ENCOUNTER — Other Ambulatory Visit: Payer: Self-pay

## 2020-08-28 ENCOUNTER — Encounter: Payer: Self-pay | Admitting: General Surgery

## 2020-08-28 DIAGNOSIS — L723 Sebaceous cyst: Secondary | ICD-10-CM | POA: Diagnosis not present

## 2020-08-28 NOTE — Progress Notes (Signed)
Sebaceous Cyst Excision Procedure Note  Pre-operative Diagnosis: Epidermal inclusion cyst  Post-operative Diagnosis: same, 1.2 cm  Locations:right shoulder  Indications: Sebaceous cyst  Anesthesia: Lidocaine 1% with epinephrine without added sodium bicarbonate  Procedure Details  History of allergy to iodine: no  Patient informed of the risks (including bleeding and infection) and benefits of the  procedure and Written informed consent obtained.  The lesion and surrounding area was given a sterile prep using chlorhexidine and draped in the usual sterile fashion. An incision was made over the cyst, which was dissected free of the surrounding tissue and removed.  The cyst was filled with typical sebaceous material.  The wound was closed with 3-0 Vicryl using simple interrupted stitches and the skin was reapproximated with running subcuticular Monocryl.  Dermabond and Steri-Strips were applied.  The specimen was not sent for pathologic examination.  During the procedure, the patient experienced a vasovagal episode and had a brief temporary loss of consciousness.  Her vital signs were evaluated.  Pulse oximetry was 98%; heart rate was 62; blood pressure was 105/64.  She was placed in Trendelenburg position as she regained consciousness.  She reported that she had only had yogurt this morning.  Blood pressure and heart rate were rechecked in the sitting position.  She demonstrated signs of orthostasis with her heart rate jumping up to 93 and her blood pressure dropping to 89/56.  She was returned to the supine position in Trendelenburg.  With some time, her vital signs improved and she felt less dizzy.  She was given water and a snack.  Her blood pressure continued to fluctuate and she began to vomit.  EMS was called and she was sent to the ED for evaluation and likely fluid resuscitation.  EBL: <1 ml  Findings: Typical sebaceous cyst without  infection.  Condition: Stable  Complications: vasovagal episode.  Plan: 1. Instructed to keep the wound dry and covered for 24-48h and clean thereafter. 2. Warning signs of infection were reviewed.   3. Recommended that the patient use OTC analgesics as needed for pain.  4. Return for wound check in 2-3 weeks. May cancel appointment if no concerns.

## 2020-08-28 NOTE — Patient Instructions (Addendum)
You may  take Tylenol and Ibuprofen for the discomfort. We have scheduled a follow up visit but if your wound looks good you may cancel the appointment.  We have removed a Cyst in our office today.  You have sutures under the skin that will dissolve and also dermabond (skin glue) on top of your skin which will come off on it's own in 10-14 days.  You may shower in 48 hours, Saturday 08/30/2020.   Avoid Strenuous activities that will make you sweat during the next 48 hours to avoid the glue coming off prematurely. Avoid activities that will place pressure to this area of the body for 1-2 weeks to avoid re-injury to incision site.  Please see your follow-up appointment provided. We will see you back in office to make sure this area is healed and to review the final pathology. If you have any questions or concerns prior to this appointment, call our office and speak with a nurse.    Excision of Skin Cysts or Lesions Excision of a skin lesion refers to the removal of a section of skin by making small cuts (incisions) in the skin. This procedure may be done to remove a cancerous (malignant) or noncancerous (benign) growth on the skin. It is typically done to treat or prevent cancer or infection. It may also be done to improve cosmetic appearance. The procedure may be done to remove:  Cancerous growths, such as basal cell carcinoma, squamous cell carcinoma, or melanoma.  Noncancerous growths, such as a cyst or lipoma.  Growths, such as moles or skin tags, which may be removed for cosmetic reasons.  Various excision or surgical techniques may be used depending on your condition, the location of the lesion, and your overall health. Tell a health care provider about:  Any allergies you have.  All medicines you are taking, including vitamins, herbs, eye drops, creams, and over-the-counter medicines.  Any problems you or family members have had with anesthetic medicines.  Any blood disorders you  have.  Any surgeries you have had.  Any medical conditions you have.  Whether you are pregnant or may be pregnant. What are the risks? Generally, this is a safe procedure. However, problems may occur, including:  Bleeding.  Infection.  Scarring.  Recurrence of the cyst, lipoma, or cancer.  Changes in skin sensation or appearance, such as discoloration or swelling.  Reaction to the anesthetics.  Allergic reaction to surgical materials or ointments.  Damage to nerves, blood vessels, muscles, or other structures.  Continued pain.  What happens before the procedure?  Ask your health care provider about: ? Changing or stopping your regular medicines. This is especially important if you are taking diabetes medicines or blood thinners. ? Taking medicines such as aspirin and ibuprofen. These medicines can thin your blood. Do not take these medicines before your procedure if your health care provider instructs you not to.  You may be asked to take certain medicines.  You may be asked to stop smoking.  You may have an exam or testing.  Plan to have someone take you home after the procedure.  Plan to have someone help you with activities during recovery. What happens during the procedure?  To reduce your risk of infection: ? Your health care team will wash or sanitize their hands. ? Your skin will be washed with soap.  You will be given a medicine to numb the area (local anesthetic).  One of the following excision techniques will be performed.  At  the end of any of these procedures, antibiotic ointment will be applied as needed. Each of the following techniques may vary among health care providers and hospitals. Complete Surgical Excision The area of skin that needs to be removed will be marked with a pen. Using a small scalpel or scissors, the surgeon will gently cut around and under the lesion until it is completely removed. The lesion will be placed in a fluid and sent  to the lab for examination. If necessary, bleeding will be controlled with a device that delivers heat (electrocautery). The edges of the wound may be stitched (sutured) together, and a bandage (dressing) will be applied. This procedure may be performed to treat a cancerous growth or a noncancerous cyst or lesion. Excision of a Cyst The surgeon will make an incision on the cyst. The entire cyst will be removed through the incision. The incision may be closed with sutures. Shave Excision During shave excision, the surgeon will use a small blade or an electrically heated loop instrument to shave off the lesion. This may be done to remove a mole or a skin tag. The wound will usually be left to heal on its own without sutures. Punch Excision During punch excision, the surgeon will use a small tool that is like a cookie cutter or a hole punch to cut a circle shape out of the skin. The outer edges of the skin will be sutured together. This may be done to remove a mole or a scar or to perform a biopsy of the lesion. Mohs Micrographic Surgery During Mohs micrographic surgery, layers of the lesion will be removed with a scalpel or a loop instrument and will be examined right away under a microscope. Layers will be removed until all of the abnormal or cancerous tissue has been removed. This procedure is minimally invasive, and it ensures the best cosmetic outcome. It involves the removal of as little normal tissue as possible. Mohs is usually done to treat skin cancer, such as basal cell carcinoma or squamous cell carcinoma, particularly on the face and ears. Depending on the size of the surgical wound, it may be sutured closed. What happens after the procedure?  Return to your normal activities as told by your health care provider.  Talk with your health care provider to discuss any test results, treatment options, and if necessary, the need for more tests. This information is not intended to replace advice  given to you by your health care provider. Make sure you discuss any questions you have with your health care provider. Document Released: 08/11/2009 Document Revised: 10/23/2015 Document Reviewed: 07/03/2014 Elsevier Interactive Patient Education  [employer].

## 2020-09-04 ENCOUNTER — Encounter: Payer: BC Managed Care – PPO | Admitting: General Surgery

## 2020-09-17 DIAGNOSIS — M0579 Rheumatoid arthritis with rheumatoid factor of multiple sites without organ or systems involvement: Secondary | ICD-10-CM | POA: Diagnosis not present

## 2020-09-17 DIAGNOSIS — Z79899 Other long term (current) drug therapy: Secondary | ICD-10-CM | POA: Diagnosis not present

## 2020-10-03 ENCOUNTER — Other Ambulatory Visit: Payer: Self-pay | Admitting: Family Medicine

## 2020-10-08 ENCOUNTER — Ambulatory Visit (INDEPENDENT_AMBULATORY_CARE_PROVIDER_SITE_OTHER): Payer: BC Managed Care – PPO | Admitting: Family Medicine

## 2020-10-08 ENCOUNTER — Other Ambulatory Visit: Payer: Self-pay

## 2020-10-08 ENCOUNTER — Encounter: Payer: Self-pay | Admitting: Family Medicine

## 2020-10-08 VITALS — BP 118/70 | HR 67 | Temp 98.4°F | Resp 16 | Ht 64.0 in | Wt 143.0 lb

## 2020-10-08 DIAGNOSIS — E559 Vitamin D deficiency, unspecified: Secondary | ICD-10-CM | POA: Diagnosis not present

## 2020-10-08 DIAGNOSIS — E7800 Pure hypercholesterolemia, unspecified: Secondary | ICD-10-CM

## 2020-10-08 DIAGNOSIS — R7303 Prediabetes: Secondary | ICD-10-CM

## 2020-10-08 DIAGNOSIS — M8580 Other specified disorders of bone density and structure, unspecified site: Secondary | ICD-10-CM

## 2020-10-08 DIAGNOSIS — Z1231 Encounter for screening mammogram for malignant neoplasm of breast: Secondary | ICD-10-CM

## 2020-10-08 DIAGNOSIS — Z0000 Encounter for general adult medical examination without abnormal findings: Secondary | ICD-10-CM

## 2020-10-08 DIAGNOSIS — Z780 Asymptomatic menopausal state: Secondary | ICD-10-CM

## 2020-10-08 NOTE — Patient Instructions (Addendum)
Mederma cream for scars   Preventive Care 33-60 Years Old, Female Preventive care refers to lifestyle choices and visits with your health care provider that can promote health and wellness. This includes:  A yearly physical exam. This is also called an annual wellness visit.  Regular dental and eye exams.  Immunizations.  Screening for certain conditions.  Healthy lifestyle choices, such as: ? Eating a healthy diet. ? Getting regular exercise. ? Not using drugs or products that contain nicotine and tobacco. ? Limiting alcohol use. What can I expect for my preventive care visit? Physical exam Your health care provider will check your:  Height and weight. These may be used to calculate your BMI (body mass index). BMI is a measurement that tells if you are at a healthy weight.  Heart rate and blood pressure.  Body temperature.  Skin for abnormal spots. Counseling Your health care provider may ask you questions about your:  Past medical problems.  Family's medical history.  Alcohol, tobacco, and drug use.  Emotional well-being.  Home life and relationship well-being.  Sexual activity.  Diet, exercise, and sleep habits.  Work and work Statistician.  Access to firearms.  Method of birth control.  Menstrual cycle.  Pregnancy history. What immunizations do I need? Vaccines are usually given at various ages, according to a schedule. Your health care provider will recommend vaccines for you based on your age, medical history, and lifestyle or other factors, such as travel or where you work.   What tests do I need? Blood tests  Lipid and cholesterol levels. These may be checked every 5 years, or more often if you are over 56 years old.  Hepatitis C test.  Hepatitis B test. Screening  Lung cancer screening. You may have this screening every year starting at age 71 if you have a 30-pack-year history of smoking and currently smoke or have quit within the past 15  years.  Colorectal cancer screening. ? All adults should have this screening starting at age 26 and continuing until age 12. ? Your health care provider may recommend screening at age 12 if you are at increased risk. ? You will have tests every 1-10 years, depending on your results and the type of screening test.  Diabetes screening. ? This is done by checking your blood sugar (glucose) after you have not eaten for a while (fasting). ? You may have this done every 1-3 years.  Mammogram. ? This may be done every 1-2 years. ? Talk with your health care provider about when you should start having regular mammograms. This may depend on whether you have a family history of breast cancer.  BRCA-related cancer screening. This may be done if you have a family history of breast, ovarian, tubal, or peritoneal cancers.  Pelvic exam and Pap test. ? This may be done every 3 years starting at age 64. ? Starting at age 4, this may be done every 5 years if you have a Pap test in combination with an HPV test. Other tests  STD (sexually transmitted disease) testing, if you are at risk.  Bone density scan. This is done to screen for osteoporosis. You may have this scan if you are at high risk for osteoporosis. Talk with your health care provider about your test results, treatment options, and if necessary, the need for more tests. Follow these instructions at home: Eating and drinking  Eat a diet that includes fresh fruits and vegetables, whole grains, lean protein, and low-fat dairy products.  Take vitamin and mineral supplements as recommended by your health care provider.  Do not drink alcohol if: ? Your health care provider tells you not to drink. ? You are pregnant, may be pregnant, or are planning to become pregnant.  If you drink alcohol: ? Limit how much you have to 0-1 drink a day. ? Be aware of how much alcohol is in your drink. In the [place] one drink equals one 12 oz bottle of beer  (355 mL), one 5 oz glass of wine (148 mL), or one 1 oz glass of hard liquor (44 mL).   Lifestyle  Take daily care of your teeth and gums. Brush your teeth every morning and night with fluoride toothpaste. Floss one time each day.  Stay active. Exercise for at least 30 minutes 5 or more days each week.  Do not use any products that contain nicotine or tobacco, such as cigarettes, e-cigarettes, and chewing tobacco. If you need help quitting, ask your health care provider.  Do not use drugs.  If you are sexually active, practice safe sex. Use a condom or other form of protection to prevent STIs (sexually transmitted infections).  If you do not wish to become pregnant, use a form of birth control. If you plan to become pregnant, see your health care provider for a prepregnancy visit.  If told by your health care provider, take low-dose aspirin daily starting at age 35.  Find healthy ways to cope with stress, such as: ? Meditation, yoga, or listening to music. ? Journaling. ? Talking to a trusted person. ? Spending time with friends and family. Safety  Always wear your seat belt while driving or riding in a vehicle.  Do not drive: ? If you have been drinking alcohol. Do not ride with someone who has been drinking. ? When you are tired or distracted. ? While texting.  Wear a helmet and other protective equipment during sports activities.  If you have firearms in your house, make sure you follow all gun safety procedures. What's next?  Visit your health care provider once a year for an annual wellness visit.  Ask your health care provider how often you should have your eyes and teeth checked.  Stay up to date on all vaccines. This information is not intended to replace advice given to you by your health care provider. Make sure you discuss any questions you have with your health care provider. Document Revised: 02/19/2020 Document Reviewed: 01/26/2018 Elsevier Patient Education   2021 [employer].

## 2020-10-08 NOTE — Progress Notes (Signed)
Name: Yvonne Young   MRN: [id number]    DOB: 11-17-1960   Date:10/08/2020       Progress Note  Subjective  Chief Complaint  Annual Exam  HPI  Patient presents for annual CPE.  The 10-year ASCVD risk score Mikey Bussing DC Jr., et al., 2013) is: 4.5%   Values used to calculate the score:     Age: 60 years     Sex: Female     Is Non-Hispanic African American: Yes     Diabetic: No     Tobacco smoker: No     Systolic Blood Pressure: 340 mmHg     Is BP treated: No     HDL Cholesterol: 53 mg/dL     Total Cholesterol: 239 mg/dL   LDL is above 160 but denies significant family history of heart attacks of strokes  Diet: balanced diet , avoids fried food and does not eat fast food  Exercise: she has not been active lately - she used to walk every morning but she is working longer hours and starting at 6 :30 am  [employer] Visit from 10/03/2019 in [facility]  AUDIT-C Score 0     Depression: Phq 9 is  negative Depression screen [facility] 2/9 10/08/2020 08/12/2020 10/03/2019 10/03/2019 09/27/2018  Decreased Interest 0 0 0 0 0  Down, Depressed, Hopeless 0 0 0 0 0  PHQ - 2 Score 0 0 0 0 0  Altered sleeping - - 0 0 0  Tired, decreased energy - - 0 0 0  Change in appetite - - 0 0 0  Feeling bad or failure about yourself  - - 0 0 0  Trouble concentrating - - 0 0 0  Moving slowly or fidgety/restless - - 0 0 0  Suicidal thoughts - - 0 0 0  PHQ-9 Score - - 0 0 0  Difficult doing work/chores - - Not difficult at all - Not difficult at all   Hypertension: BP Readings from Last 3 Encounters:  10/08/20 118/70  08/28/20 115/75  08/21/20 119/81   Obesity: Wt Readings from Last 3 Encounters:  10/08/20 143 lb (64.9 kg)  08/28/20 141 lb 6.4 oz (64.1 kg)  08/21/20 140 lb 9.6 oz (63.8 kg)   BMI Readings from Last 3 Encounters:  10/08/20 24.55 kg/m  08/28/20 24.27 kg/m  08/21/20 24.13 kg/m     Vaccines:  Shingrix:up to date  COVID-19 : up to date  Pneumonia: educated  and discussed with patient. Flu: educated and discussed with patient.  Hep C Screening: 09/27/18 STD testing and prevention (HIV/chl/gon/syphilis): 10/03/19 Intimate partner violence: negative Sexual History : one partner past 2 years, no vaginal discharge or pain during sex  Menstrual History/LMP/Abnormal Bleeding: s/p hysterectomy  Incontinence Symptoms: no problems  Breast cancer:  - Last Mammogram: 11/14/19 - BRCA gene screening: N/A  Osteoporosis: Discussed high calcium and vitamin D supplementation, weight bearing exercises  Cervical cancer screening: N/A  Skin cancer: Discussed monitoring for atypical lesions  Colorectal cancer: 07/30/11   Lung cancer: Low Dose CT Chest recommended if Age 66-80 years, 20 pack-year currently smoking OR have quit w/in 15years. Patient does not qualify.   ECG: 09/21/17  Advanced Care Planning: A voluntary discussion about advance care planning including the explanation and discussion of advance directives.  Discussed health care proxy and Living will, and the patient was able to identify a health care proxy as Theresia Majors, sister   Lipids: Lab Results  Component Value Date  CHOL 239 (H) 10/03/2019   CHOL 239 (H) 09/27/2018   CHOL 231 (H) 09/21/2017   Lab Results  Component Value Date   HDL 53 10/03/2019   HDL 57 09/27/2018   HDL 53 09/21/2017   Lab Results  Component Value Date   LDLCALC 167 (H) 10/03/2019   LDLCALC 162 (H) 09/27/2018   LDLCALC 159 (H) 09/21/2017   Lab Results  Component Value Date   TRIG 85 10/03/2019   TRIG 94 09/27/2018   TRIG 87 09/21/2017   Lab Results  Component Value Date   CHOLHDL 4.5 10/03/2019   CHOLHDL 4.2 09/27/2018   CHOLHDL 4.4 09/21/2017   No results found for: LDLDIRECT  Glucose: Glucose, Bld  Date Value Ref Range Status  10/03/2019 87 65 - 99 mg/dL Final    Comment:    .            Fasting reference interval .   09/17/2016 70 65 - 99 mg/dL Final    Patient Active Problem List    Diagnosis Date Noted  . Sinus bradycardia 09/21/2017  . Fibrocystic breast disease 07/01/2015  . History of iron deficiency 07/01/2015  . Allergic rhinitis, seasonal 07/01/2015  . Vitamin D deficiency 07/01/2015    Past Surgical History:  Procedure Laterality Date  . ABDOMINAL HYSTERECTOMY  1999   w/o bilateral oophorectomy    Family History  Problem Relation Age of Onset  . Hypothyroidism Mother   . Hypertension Mother   . Breast cancer Neg Hx     Social History   Socioeconomic History  . Marital status: Single    Spouse name: Not on file  . Number of children: 1  . Years of education: 26  . Highest education level: High school graduate  Occupational History  . Occupation: Engineer, manufacturing systems: [employer]  . Occupation: Therapist, art    Employer: [employer]  Tobacco Use  . Smoking status: Never Smoker  . Smokeless tobacco: Never Used  Vaping Use  . Vaping Use: Never used  Substance and Sexual Activity  . Alcohol use: No    Alcohol/week: 0.0 standard drinks  . Drug use: No  . Sexual activity: Yes    Partners: Male  Other Topics Concern  . Not on file  Social History Narrative   Lives by herself, dating, she was married at age 51 and divorced at age 73 , re-married at age 69 and widow after one year.    She has two jobs - works for [employer] ( remote ) and also at [employer] [place]rain: [place]   . Difficulty of Paying Living Expenses: Not hard at all  Food Insecurity: No Food Insecurity  . Worried About Charity fundraiser in the Last Year: Never true  . Ran Out of Food in the Last Year: Never true  Transportation Needs: No Transportation Needs  . Lack of Transportation (Medical): No  . Lack of Transportation (Non-Medical): No  Physical Activity: Inactive  . Days of Exercise per Week: 0 days  . Minutes of Exercise per Session: 0 min  Stress: No Stress Concern Present  .  Feeling of Stress : Only a little  Social Connections: Moderately Integrated  . Frequency of Communication with Friends and Family: Three times a week  . Frequency of Social Gatherings with Friends and Family: Once a week  . Attends Religious Services: More than 4 times per year  .  Active Member of Clubs or Organizations: No  . Attends Archivist Meetings: Never  . Marital Status: Living with partner  Intimate Partner Violence: Not At Risk  . Fear of Current or Ex-Partner: No  . Emotionally Abused: No  . Physically Abused: No  . Sexually Abused: No     Current Outpatient Medications:  .  Cholecalciferol (VITAMIN D3) 1000 units CAPS, Take by mouth., Disp: , Rfl:  .  etanercept (ENBREL) 50 MG/ML injection, Inject into the skin., Disp: , Rfl:  .  methotrexate (RHEUMATREX) 2.5 MG tablet, Take 4 tablets (10 mg total) by mouth once a week., Disp: 16 tablet, Rfl: 0  Allergies  Allergen Reactions  . Amoxicillin Nausea Only    Had nausea for 3 days, and this stopped when she discontinued the medication.     ROS  Constitutional: Negative for fever or weight change.  Respiratory: Negative for cough and shortness of breath.   Cardiovascular: Negative for chest pain or palpitations.  Gastrointestinal: Negative for abdominal pain, no bowel changes.  Musculoskeletal: Negative for gait problem or joint swelling.  Skin: Negative for rash.  Neurological: Negative for dizziness or headache.  No other specific complaints in a complete review of systems (except as listed in HPI above).  Objective  Vitals:   10/08/20 0742  BP: 118/70  Pulse: 67  Resp: 16  Temp: 98.4 F (36.9 C)  TempSrc: Oral  SpO2: 99%  Weight: 143 lb (64.9 kg)  Height: '5\' 4"'  (1.626 m)    Body mass index is 24.55 kg/m.  Physical Exam  Constitutional: Patient appears well-developed and well-nourished. No distress.  HENT: Head: Normocephalic and atraumatic. Ears: B TMs ok, no erythema or effusion; Nose:  Not done. Mouth/Throat: not done  Eyes: Conjunctivae and EOM are normal. Pupils are equal, round, and reactive to light. No scleral icterus.  Neck: Normal range of motion. Neck supple. No JVD present. No thyromegaly present.  Cardiovascular: Normal rate, regular rhythm and normal heart sounds.  No murmur heard. No BLE edema. Pulmonary/Chest: Effort normal and breath sounds normal. No respiratory distress. Abdominal: Soft. Bowel sounds are normal, no distension. There is no tenderness. no masses Breast: no lumps or masses, no nipple discharge or rashes FEMALE GENITALIA:  Not done  RECTAL: not done Musculoskeletal: Normal range of motion, no joint effusions. No gross deformities Neurological: he is alert and oriented to person, place, and time. No cranial nerve deficit. Coordination, balance, strength, speech and gait are normal.  Skin: Skin is warm and dry. No rash noted. No erythema. scar from removal of cyst on top of right shoulder, advised Mederma cream  Psychiatric: Patient has a normal mood and affect. behavior is normal. Judgment and thought content normal.  Fall Risk: Fall Risk  10/08/2020 08/28/2020 08/21/2020 08/12/2020 10/03/2019  Falls in the past year? 0 0 0 0 0  Number falls in past yr: 0 - - 0 0  Injury with Fall? 0 - - 0 0  Follow up - - - Falls prevention discussed -     Functional Status Survey: Is the patient deaf or have difficulty hearing?: No Does the patient have difficulty seeing, even when wearing glasses/contacts?: Yes Does the patient have difficulty concentrating, remembering, or making decisions?: No Does the patient have difficulty walking or climbing stairs?: No Does the patient have difficulty dressing or bathing?: No Does the patient have difficulty doing errands alone such as visiting a doctor's office or shopping?: No   Assessment & Plan  1. Well adult exam   2. Vitamin D deficiency  - VITAMIN D 25 Hydroxy (Vit-D Deficiency, Fractures)  3.  Osteopenia after menopause  Discussed results, and high calcium diet, increase vitamin D supplementation to 2000 units per day   4. Pre-diabetes  - Hemoglobin A1c  5. Pure hypercholesterolemia  - Lipid panel  6. Breast cancer screening by mammogram  - MM 3D SCREEN BREAST BILATERAL; Future   -USPSTF grade A and B recommendations reviewed with patient; age-appropriate recommendations, preventive care, screening tests, etc discussed and encouraged; healthy living encouraged; see AVS for patient education given to patient -Discussed importance of 150 minutes of physical activity weekly, eat two servings of fish weekly, eat one serving of tree nuts ( cashews, pistachios, pecans, almonds.Marland Kitchen) every other day, eat 6 servings of fruit/vegetables daily and drink plenty of water and avoid sweet beverages.

## 2020-10-09 LAB — HEMOGLOBIN A1C
Hgb A1c MFr Bld: 5.5 % of total Hgb (ref <5.7)
Mean Plasma Glucose: 111 mg/dL
eAG (mmol/L): 6.2 mmol/L

## 2020-10-09 LAB — LIPID PANEL
Cholesterol: 242 mg/dL — ABNORMAL HIGH (ref <200)
HDL: 60 mg/dL (ref >=50)
LDL Cholesterol (Calc): 163 mg/dL (calc) — ABNORMAL HIGH
Non-HDL Cholesterol (Calc): 182 mg/dL (calc) — ABNORMAL HIGH (ref <130)
Total CHOL/HDL Ratio: 4 (calc) (ref <5.0)
Triglycerides: 84 mg/dL (ref <150)

## 2020-10-09 LAB — VITAMIN D 25 HYDROXY (VIT D DEFICIENCY, FRACTURES): Vit D, 25-Hydroxy: 31 ng/mL (ref 30–100)

## 2020-11-14 ENCOUNTER — Other Ambulatory Visit: Payer: Self-pay

## 2020-11-14 ENCOUNTER — Ambulatory Visit
Admission: RE | Admit: 2020-11-14 | Discharge: 2020-11-14 | Disposition: A | Discharge status: Home / Self Care | Payer: BC Managed Care – PPO | Source: Ambulatory Visit | Attending: Family Medicine | Admitting: Family Medicine

## 2020-11-14 DIAGNOSIS — Z1231 Encounter for screening mammogram for malignant neoplasm of breast: Secondary | ICD-10-CM | POA: Diagnosis not present

## 2020-11-18 ENCOUNTER — Other Ambulatory Visit: Payer: Self-pay | Admitting: Family Medicine

## 2020-11-18 DIAGNOSIS — R928 Other abnormal and inconclusive findings on diagnostic imaging of breast: Secondary | ICD-10-CM

## 2020-11-18 DIAGNOSIS — N6489 Other specified disorders of breast: Secondary | ICD-10-CM

## 2020-11-26 ENCOUNTER — Ambulatory Visit
Admission: RE | Admit: 2020-11-26 | Discharge: 2020-11-26 | Disposition: A | Discharge status: Home / Self Care | Payer: BC Managed Care – PPO | Source: Ambulatory Visit | Attending: Family Medicine | Admitting: Family Medicine

## 2020-11-26 ENCOUNTER — Other Ambulatory Visit: Payer: Self-pay

## 2020-11-26 DIAGNOSIS — N6489 Other specified disorders of breast: Secondary | ICD-10-CM | POA: Diagnosis not present

## 2020-11-26 DIAGNOSIS — R928 Other abnormal and inconclusive findings on diagnostic imaging of breast: Secondary | ICD-10-CM | POA: Diagnosis not present

## 2020-11-26 DIAGNOSIS — R922 Inconclusive mammogram: Secondary | ICD-10-CM | POA: Diagnosis not present

## 2020-12-17 DIAGNOSIS — M0579 Rheumatoid arthritis with rheumatoid factor of multiple sites without organ or systems involvement: Secondary | ICD-10-CM | POA: Diagnosis not present

## 2020-12-17 DIAGNOSIS — Z79899 Other long term (current) drug therapy: Secondary | ICD-10-CM | POA: Diagnosis not present

## 2021-03-03 ENCOUNTER — Other Ambulatory Visit: Payer: Self-pay

## 2021-03-03 ENCOUNTER — Ambulatory Visit
Admission: RE | Admit: 2021-03-03 | Discharge: 2021-03-03 | Disposition: A | Discharge status: Home / Self Care | Payer: BC Managed Care – PPO | Source: Ambulatory Visit | Attending: Family Medicine | Admitting: Family Medicine

## 2021-03-03 DIAGNOSIS — N6321 Unspecified lump in the left breast, upper outer quadrant: Secondary | ICD-10-CM | POA: Diagnosis not present

## 2021-03-03 DIAGNOSIS — R928 Other abnormal and inconclusive findings on diagnostic imaging of breast: Secondary | ICD-10-CM | POA: Insufficient documentation

## 2021-03-09 ENCOUNTER — Encounter: Payer: Self-pay | Admitting: General Surgery

## 2021-03-11 ENCOUNTER — Ambulatory Visit (INDEPENDENT_AMBULATORY_CARE_PROVIDER_SITE_OTHER): Payer: BC Managed Care – PPO

## 2021-03-11 ENCOUNTER — Other Ambulatory Visit: Payer: Self-pay

## 2021-03-11 DIAGNOSIS — Z23 Encounter for immunization: Secondary | ICD-10-CM | POA: Diagnosis not present

## 2021-03-12 ENCOUNTER — Telehealth: Payer: Self-pay

## 2021-03-12 NOTE — Telephone Encounter (Signed)
Immunization record mailed. 

## 2021-03-12 NOTE — Telephone Encounter (Signed)
Copied from CRM [id number]. Topic: General - Other >> Mar 12, 2021  8:26 AM Wyonia Hough E wrote: Reason for CRM: Pt would like a copy of her flu shot record from yesterday mailed to her home for work/ please advise / address  has been confirmed

## 2021-03-18 DIAGNOSIS — Z79899 Other long term (current) drug therapy: Secondary | ICD-10-CM | POA: Diagnosis not present

## 2021-03-18 DIAGNOSIS — M0579 Rheumatoid arthritis with rheumatoid factor of multiple sites without organ or systems involvement: Secondary | ICD-10-CM | POA: Diagnosis not present

## 2021-03-24 DIAGNOSIS — M0579 Rheumatoid arthritis with rheumatoid factor of multiple sites without organ or systems involvement: Secondary | ICD-10-CM | POA: Diagnosis not present

## 2021-04-01 ENCOUNTER — Ambulatory Visit: Payer: BC Managed Care – PPO | Admitting: Family Medicine

## 2021-04-01 ENCOUNTER — Other Ambulatory Visit: Payer: Self-pay

## 2021-04-01 ENCOUNTER — Encounter: Payer: Self-pay | Admitting: Family Medicine

## 2021-04-01 VITALS — BP 116/70 | HR 67 | Temp 97.7°F | Resp 16 | Ht 64.0 in | Wt 143.0 lb

## 2021-04-01 DIAGNOSIS — M059 Rheumatoid arthritis with rheumatoid factor, unspecified: Secondary | ICD-10-CM | POA: Diagnosis not present

## 2021-04-01 DIAGNOSIS — Z1211 Encounter for screening for malignant neoplasm of colon: Secondary | ICD-10-CM

## 2021-04-01 DIAGNOSIS — E7800 Pure hypercholesterolemia, unspecified: Secondary | ICD-10-CM | POA: Insufficient documentation

## 2021-04-01 DIAGNOSIS — E559 Vitamin D deficiency, unspecified: Secondary | ICD-10-CM

## 2021-04-01 DIAGNOSIS — Z780 Asymptomatic menopausal state: Secondary | ICD-10-CM

## 2021-04-01 DIAGNOSIS — M8580 Other specified disorders of bone density and structure, unspecified site: Secondary | ICD-10-CM | POA: Diagnosis not present

## 2021-04-01 DIAGNOSIS — R7303 Prediabetes: Secondary | ICD-10-CM

## 2021-04-01 NOTE — Progress Notes (Signed)
Name: Yvonne Young   MRN: [id number]    DOB: [birth year]   Date:04/01/2021       Progress Note  Subjective  Chief Complaint  Follow Up  HPI   LDL is above 160 but denies significant family history of heart attacks of strokes  The 10-year ASCVD risk score (Arnett DK, et al., 2019) is: 4%   Values used to calculate the score:     Age: 60 years     Sex: Female     Is Non-Hispanic African American: Yes     Diabetic: No     Tobacco smoker: No     Systolic Blood Pressure: 116 mmHg     Is BP treated: No     HDL Cholesterol: 60 mg/dL     Total Cholesterol: 242 mg/dL    RA: still feels stiff at times, but pain has been under control, she is compliant with medications and visits to rheumatologist, last visit with Dr. Gavin Potters was end of October and sed rate was normal . On Enbrel on Fridays and Methotrexate down to 2 pills every Thursday ( just went down on the dose ).  Pre-diabetes: HgbA1C was 5.8% in 2019 but down to 5.5% in May 2022  She denies polyphagia, polydipsia or polyuria. She has normal glutamic acid decarboxylase auto antibodies.    Osteopenia: bone density done 11/14/19 showed a drop of bone mass at spine and right femur down from -1.8 to -2.3, explained -2.5 is osteoporosis. FRAX is still low. Discussed weight bearing activity, continue vitamin D supplementation and high calcium diet. She is status post hysterectomy at 28   Patient Active Problem List   Diagnosis Date Noted   Sinus bradycardia 09/21/2017   Fibrocystic breast disease 07/01/2015   History of iron deficiency 07/01/2015   Allergic rhinitis, seasonal 07/01/2015   Vitamin D deficiency 07/01/2015    Past Surgical History:  Procedure Laterality Date   ABDOMINAL HYSTERECTOMY  1999   w/o bilateral oophorectomy    Family History  Problem Relation Age of Onset   Hypothyroidism Mother    Hypertension Mother    Breast cancer Neg Hx     Social History   Tobacco Use   Smoking status: Never   Smokeless  tobacco: Never  Substance Use Topics   Alcohol use: No    Alcohol/week: 0.0 standard drinks     Current Outpatient Medications:    Cholecalciferol (VITAMIN D3) 50 MCG (2000 UT) CAPS, Take 1 capsule by mouth daily., Disp: , Rfl:    etanercept (ENBREL) 50 MG/ML injection, Inject into the skin., Disp: , Rfl:    methotrexate (RHEUMATREX) 2.5 MG tablet, Take 4 tablets (10 mg total) by mouth once a week., Disp: 16 tablet, Rfl: 0  Allergies  Allergen Reactions   Amoxicillin Nausea Only    Had nausea for 3 days, and this stopped when she discontinued the medication.    I personally reviewed active problem list, medication list, allergies, family history, social history, health maintenance with the patient/caregiver today.   ROS  Constitutional: Negative for fever or weight change.  Respiratory: Negative for cough and shortness of breath.   Cardiovascular: Negative for chest pain or palpitations.  Gastrointestinal: Negative for abdominal pain, no bowel changes.  Musculoskeletal: Negative for gait problem or joint swelling.  Skin: Negative for rash.  Neurological: Negative for dizziness or headache.  No other specific complaints in a complete review of systems (except as listed in HPI above).   Objective  Vitals:  04/01/21 0745  BP: 116/70  Pulse: 67  Resp: 16  Temp: 97.7 F (36.5 C)  SpO2: 99%  Weight: 143 lb (64.9 kg)  Height: 5\' 4"  (1.626 m)    Body mass index is 24.55 kg/m.  Physical Exam  Constitutional: Patient appears well-developed and well-nourished. No distress.  HEENT: head atraumatic, normocephalic, pupils equal and reactive to light, neck supple Cardiovascular: Normal rate, regular rhythm and normal heart sounds.  No murmur heard. No BLE edema. Pulmonary/Chest: Effort normal and breath sounds normal. No respiratory distress. Abdominal: Soft.  There is no tenderness. Psychiatric: Patient has a normal mood and affect. behavior is normal. Judgment and thought  content normal.    PHQ2/9: Depression screen [facility] 2/9 04/01/2021 10/08/2020 08/12/2020 10/03/2019 10/03/2019  Decreased Interest 0 0 0 0 0  Down, Depressed, Hopeless 0 0 0 0 0  PHQ - 2 Score 0 0 0 0 0  Altered sleeping 0 - - 0 0  Tired, decreased energy 0 - - 0 0  Change in appetite 0 - - 0 0  Feeling bad or failure about yourself  0 - - 0 0  Trouble concentrating 0 - - 0 0  Moving slowly or fidgety/restless 0 - - 0 0  Suicidal thoughts 0 - - 0 0  PHQ-9 Score 0 - - 0 0  Difficult doing work/chores - - - Not difficult at all -    phq 9 is negative   Fall Risk: Fall Risk  04/01/2021 10/08/2020 08/28/2020 08/21/2020 08/12/2020  Falls in the past year? 0 0 0 0 0  Number falls in past yr: 0 0 - - 0  Injury with Fall? 0 0 - - 0  Risk for fall due to : No Fall Risks - - - -  Follow up Falls prevention discussed - - - Falls prevention discussed      Functional Status Survey: Is the patient deaf or have difficulty hearing?: No Does the patient have difficulty seeing, even when wearing glasses/contacts?: No Does the patient have difficulty concentrating, remembering, or making decisions?: No Does the patient have difficulty walking or climbing stairs?: No Does the patient have difficulty dressing or bathing?: No Does the patient have difficulty doing errands alone such as visiting a doctor's office or shopping?: No    Assessment & Plan  1. Seropositive rheumatoid arthritis (HCC)  Up to date with visits, doing well   2. Pure hypercholesterolemia  Continue healthy diet   3. Osteopenia after menopause  We will recheck level next year   4. Vitamin D deficiency  Continue supplementation   5. Pre-diabetes  Recheck levels yearly   6. Colon cancer screening  - Ambulatory referral to Gastroenterology

## 2021-04-02 ENCOUNTER — Other Ambulatory Visit: Payer: Self-pay

## 2021-04-02 DIAGNOSIS — Z1211 Encounter for screening for malignant neoplasm of colon: Secondary | ICD-10-CM

## 2021-04-02 NOTE — Progress Notes (Signed)
Gastroenterology Pre-Procedure Review  Request Date: 08/03/2021 Requesting Physician: Dr. Allegra Lai  PATIENT REVIEW QUESTIONS: The patient responded to the following health history questions as indicated:  Colonoscopy is 10 year Recall  1. Are you having any GI issues? no 2. Do you have a personal history of Polyps? no 3. Do you have a family history of Colon Cancer or Polyps? no 4. Diabetes Mellitus? no 5. Joint replacements in the past 12 months?no 6. Major health problems in the past 3 months?no 7. Any artificial heart valves, MVP, or defibrillator?no    MEDICATIONS & ALLERGIES:    Patient reports the following regarding taking any anticoagulation/antiplatelet therapy:   Plavix, Coumadin, Eliquis, Xarelto, Lovenox, Pradaxa, Brilinta, or Effient? no Aspirin? no  Patient confirms/reports the following medications:  Current Outpatient Medications  Medication Sig Dispense Refill   Cholecalciferol (VITAMIN D3) 50 MCG (2000 UT) CAPS Take 1 capsule by mouth daily.     etanercept (ENBREL) 50 MG/ML injection Inject into the skin.     methotrexate (RHEUMATREX) 2.5 MG tablet Take 4 tablets (10 mg total) by mouth once a week. 16 tablet 0   No current facility-administered medications for this visit.    Patient confirms/reports the following allergies:  Allergies  Allergen Reactions   Amoxicillin Nausea Only    Had nausea for 3 days, and this stopped when she discontinued the medication.    No orders of the defined types were placed in this encounter.   AUTHORIZATION INFORMATION Primary Insurance: 1D#: Group #:  Secondary Insurance: 1D#: Group #:  SCHEDULE INFORMATION: Date: 08/03/2021 Time: Location: [facility]

## 2021-07-30 ENCOUNTER — Telehealth: Payer: Self-pay | Admitting: Gastroenterology

## 2021-07-30 NOTE — Telephone Encounter (Signed)
Pt is needing prep for colonoscpy on Monday called [facility] [address] ?

## 2021-07-31 ENCOUNTER — Encounter: Payer: Self-pay | Admitting: Gastroenterology

## 2021-07-31 ENCOUNTER — Other Ambulatory Visit: Payer: Self-pay

## 2021-07-31 MED ORDER — NA SULFATE-K SULFATE-MG SULF 17.5-3.13-1.6 GM/177ML PO SOLN: 1.0000 | Freq: Once | ORAL | 0 refills | Status: AC

## 2021-07-31 NOTE — Telephone Encounter (Signed)
Suprep has been sent to pharmacy of choice. ?

## 2021-08-03 ENCOUNTER — Encounter
Admission: RE | Disposition: A | Discharge status: Home / Self Care | Payer: Self-pay | Source: Home / Self Care | Attending: Gastroenterology

## 2021-08-03 ENCOUNTER — Ambulatory Visit: Payer: BC Managed Care – PPO | Admitting: Registered Nurse

## 2021-08-03 ENCOUNTER — Ambulatory Visit
Admission: RE | Admit: 2021-08-03 | Discharge: 2021-08-03 | Disposition: A | Discharge status: Home / Self Care | Payer: BC Managed Care – PPO | Attending: Gastroenterology | Admitting: Gastroenterology

## 2021-08-03 ENCOUNTER — Other Ambulatory Visit: Payer: Self-pay

## 2021-08-03 ENCOUNTER — Encounter: Payer: Self-pay | Admitting: Gastroenterology

## 2021-08-03 DIAGNOSIS — E559 Vitamin D deficiency, unspecified: Secondary | ICD-10-CM | POA: Insufficient documentation

## 2021-08-03 DIAGNOSIS — Z1211 Encounter for screening for malignant neoplasm of colon: Secondary | ICD-10-CM | POA: Diagnosis not present

## 2021-08-03 DIAGNOSIS — K649 Unspecified hemorrhoids: Secondary | ICD-10-CM | POA: Diagnosis not present

## 2021-08-03 DIAGNOSIS — K644 Residual hemorrhoidal skin tags: Secondary | ICD-10-CM | POA: Insufficient documentation

## 2021-08-03 DIAGNOSIS — M069 Rheumatoid arthritis, unspecified: Secondary | ICD-10-CM | POA: Insufficient documentation

## 2021-08-03 HISTORY — PX: COLONOSCOPY WITH PROPOFOL: SHX5780

## 2021-08-03 SURGERY — COLONOSCOPY WITH PROPOFOL
Anesthesia: General

## 2021-08-03 MED ORDER — PROPOFOL 500 MG/50ML IV EMUL
INTRAVENOUS | Status: AC
  Filled 2021-08-03: qty 150

## 2021-08-03 MED ORDER — PROPOFOL 10 MG/ML IV BOLUS
INTRAVENOUS | Status: DC | PRN
  Administered 2021-08-03: 10 mg via INTRAVENOUS
  Administered 2021-08-03: 60 mg via INTRAVENOUS

## 2021-08-03 MED ORDER — LIDOCAINE HCL (CARDIAC) PF 100 MG/5ML IV SOSY
PREFILLED_SYRINGE | INTRAVENOUS | Status: DC | PRN
  Administered 2021-08-03: 100 mg via INTRAVENOUS

## 2021-08-03 MED ORDER — PROPOFOL 500 MG/50ML IV EMUL
INTRAVENOUS | Status: DC | PRN
  Administered 2021-08-03: 150 ug/kg/min via INTRAVENOUS

## 2021-08-03 MED ORDER — SODIUM CHLORIDE 0.9 % IV SOLN: INTRAVENOUS | Status: DC

## 2021-08-03 NOTE — H&P (Signed)
?Arlyss Repress, MD ?[address]  ?[address]  ?[place], [place] [address]  ?Main: [phone]  ?Fax: [phone] ?Pager: [phone] ? ?Primary Care Physician:  Alba Cory, MD ?Primary Gastroenterologist:  Dr. Arlyss Repress ? ?Pre-Procedure History & Physical: ?HPI:  Yvonne Young is a 61 y.o. female is here for an colonoscopy. ?  ?Past Medical History:  ?Diagnosis Date  ? AR (allergic rhinitis)   ? Fibrocystic breast   ? Osteopenia   ? Postsurgical menopause   ? RA (rheumatoid arthritis) (HCC)   ? Dr. Gavin Potters  ? Rheumatoid arthritis (HCC)   ? Vitamin D deficiency   ? ? ?Past Surgical History:  ?Procedure Laterality Date  ? ABDOMINAL HYSTERECTOMY  05/31/1997  ? w/o bilateral oophorectomy  ? COLONOSCOPY    ? ? ?Prior to Admission medications   ?Medication Sig Start Date End Date Taking? Authorizing Provider  ?Cholecalciferol (VITAMIN D3) 50 MCG (2000 UT) CAPS Take 1 capsule by mouth daily.   Yes [provider]  ?etanercept (ENBREL) 50 MG/ML injection Inject into the skin. 06/26/15  Yes [provider]  ?methotrexate (RHEUMATREX) 2.5 MG tablet Take 4 tablets (10 mg total) by mouth once a week. 09/21/17  Yes Alba Cory, MD  ? ? ?Allergies as of 04/02/2021 - Review Complete 04/01/2021  ?Allergen Reaction Noted  ? Amoxicillin Nausea Only 06/09/2017  ? ? ?Family History  ?Problem Relation Age of Onset  ? Hypothyroidism Mother   ? Hypertension Mother   ? Breast cancer Neg Hx   ? ? ?Social History  ? ?Socioeconomic History  ? Marital status: Single  ?  Spouse name: Not on file  ? Number of children: 1  ? Years of education: 57  ? Highest education level: High school graduate  ?Occupational History  ? Occupation: provider template services  ?  Employer: [employer]  ? Occupation: Producer, television/film/video  ?  Employer: [employer]  ?Tobacco Use  ? Smoking status: Never  ? Smokeless tobacco: Never  ?Vaping Use  ? Vaping Use: Never used  ?Substance and Sexual Activity  ? Alcohol use: No  ?   Alcohol/week: 0.0 standard drinks  ? Drug use: No  ? Sexual activity: Yes  ?  Partners: Male  ?Other Topics Concern  ? Not on file  ?Social History Narrative  ? Lives by herself, dating, she was married at age 9 and divorced at age 59 , re-married at age 83 and widow after one year.   ? She has two jobs - works for [employer] ( remote ) and also at [employer]  ? ?Social Determinants of Health  ? ?Financial Resource Strain: Low Risk   ? Difficulty of Paying Living Expenses: Not hard at all  ?Food Insecurity: No Food Insecurity  ? Worried About Programme researcher, broadcasting/film/video in the Last Year: Never true  ? Ran Out of Food in the Last Year: Never true  ?Transportation Needs: No Transportation Needs  ? Lack of Transportation (Medical): No  ? Lack of Transportation (Non-Medical): No  ?Physical Activity: Inactive  ? Days of Exercise per Week: 0 days  ? Minutes of Exercise per Session: 0 min  ?Stress: No Stress Concern Present  ? Feeling of Stress : Only a little  ?Social Connections: Moderately Integrated  ? Frequency of Communication with Friends and Family: Three times a week  ? Frequency of Social Gatherings with Friends and Family: Once a week  ? Attends Religious Services: More than 4 times per year  ?  Active Member of Clubs or Organizations: No  ? Attends BankerClub or Organization Meetings: Never  ? Marital Status: Living with partner  ?Intimate Partner Violence: Not At Risk  ? Fear of Current or Ex-Partner: No  ? Emotionally Abused: No  ? Physically Abused: No  ? Sexually Abused: No  ? ? ?Review of Systems: ?See HPI, otherwise negative ROS ? ?Physical Exam: ?BP 129/80   Pulse 73   Temp (!) 96.9 ?F (36.1 ?C)   Resp 18   Ht 5' 3.5" (1.613 m)   Wt 61.2 kg   SpO2 100%   BMI 23.54 kg/m?  ?General:   Alert,  pleasant and cooperative in NAD ?Head:  Normocephalic and atraumatic. ?Neck:  Supple; no masses or thyromegaly. ?Lungs:  Clear throughout to auscultation.    ?Heart:  Regular rate and rhythm. ?Abdomen:  Soft, nontender  and nondistended. Normal bowel sounds, without guarding, and without rebound.   ?Neurologic:  Alert and  oriented x4;  grossly normal neurologically. ? ?Impression/Plan: ?Charlton HawsSharon H Greenwood is here for an colonoscopy to be performed for colon cancer screening ? ?Risks, benefits, limitations, and alternatives regarding  colonoscopy have been reviewed with the patient.  Questions have been answered.  All parties agreeable. ? ? ?Lannette Donathohini Sinjin Amero, MD  08/03/2021, 8:01 AM ?

## 2021-08-03 NOTE — Anesthesia Postprocedure Evaluation (Signed)
Anesthesia Post Note ? ?Patient: Yvonne Young ? ?Procedure(s) Performed: COLONOSCOPY WITH PROPOFOL ? ?Patient location during evaluation: PACU ?Anesthesia Type: General ?Level of consciousness: awake and alert ?Pain management: pain level controlled ?Vital Signs Assessment: post-procedure vital signs reviewed and stable ?Respiratory status: spontaneous breathing, nonlabored ventilation, respiratory function stable and patient connected to nasal cannula oxygen ?Cardiovascular status: blood pressure returned to baseline and stable ?Postop Assessment: no apparent nausea or vomiting ?Anesthetic complications: no ? ? ?No notable events documented. ? ? ?Last Vitals:  ?Vitals:  ? 08/03/21 0858 08/03/21 0908  ?BP: 113/82 113/84  ?Pulse: 60 (!) 55  ?Resp: 14 11  ?Temp:    ?SpO2: 100% 100%  ?  ?Last Pain:  ?Vitals:  ? 08/03/21 0908  ?TempSrc:   ?PainSc: 0-No pain  ? ? ?  ?  ?  ?  ?  ?  ? ?Yevette Edwards ? ? ? ? ?

## 2021-08-03 NOTE — Op Note (Signed)
[facility] ?Gastroenterology ?Patient Name: Yvonne Young ?Procedure Date: 08/03/2021 8:06 AM ?MRN: [id number] ?Account #: [id number] ?Date of Birth: 05-Jan-1961 ?Admit Type: Outpatient ?Age: 61 ?Room: [facility] ENDO ROOM 1 ?Gender: Female ?Note Status: Finalized ?Instrument Name: Peds Colonoscope [id number] ?Procedure:             Colonoscopy ?Indications:           Screening for colorectal malignant neoplasm, Last  ?                       colonoscopy 10 years ago ?Providers:             Toney Reil MD, MD ?Referring MD:          Onnie Boer. Carlynn Purl, MD (Referring MD) ?Medicines:             General Anesthesia ?Complications:         No immediate complications. Estimated blood loss: None. ?Procedure:             Pre-Anesthesia Assessment: ?                       - Prior to the procedure, a History and Physical was  ?                       performed, and patient medications and allergies were  ?                       reviewed. The patient is competent. The risks and  ?                       benefits of the procedure and the sedation options and  ?                       risks were discussed with the patient. All questions  ?                       were answered and informed consent was obtained.  ?                       Patient identification and proposed procedure were  ?                       verified by the physician, the nurse, the  ?                       anesthesiologist, the anesthetist and the technician  ?                       in the pre-procedure area in the procedure room in the  ?                       endoscopy suite. Mental Status Examination: alert and  ?                       oriented. Airway Examination: normal oropharyngeal  ?                       airway and neck mobility. Respiratory Examination:  ?  clear to auscultation. CV Examination: normal.  ?                       Prophylactic Antibiotics: The patient does not require  ?                       prophylactic  antibiotics. Prior Anticoagulants: The  ?                       patient has taken no previous anticoagulant or  ?                       antiplatelet agents. ASA Grade Assessment: II - A  ?                       patient with mild systemic disease. After reviewing  ?                       the risks and benefits, the patient was deemed in  ?                       satisfactory condition to undergo the procedure. The  ?                       anesthesia plan was to use general anesthesia.  ?                       Immediately prior to administration of medications,  ?                       the patient was re-assessed for adequacy to receive  ?                       sedatives. The heart rate, respiratory rate, oxygen  ?                       saturations, blood pressure, adequacy of pulmonary  ?                       ventilation, and response to care were monitored  ?                       throughout the procedure. The physical status of the  ?                       patient was re-assessed after the procedure. ?                       After obtaining informed consent, the colonoscope was  ?                       passed under direct vision. Throughout the procedure,  ?                       the patient's blood pressure, pulse, and oxygen  ?                       saturations were monitored continuously. The  ?  Colonoscope was introduced through the anus and  ?                       advanced to the the cecum, identified by appendiceal  ?                       orifice and ileocecal valve. The colonoscopy was  ?                       performed without difficulty. The patient tolerated  ?                       the procedure well. The quality of the bowel  ?                       preparation was evaluated using the BBPS [facility] Bowel  ?                       Preparation Scale) with scores of: Right Colon = 3,  ?                       Transverse Colon = 3 and Left Colon = 3 (entire mucosa  ?                        seen well with no residual staining, small fragments  ?                       of stool or opaque liquid). The total BBPS score  ?                       equals 9. ?Findings: ?     The perianal and digital rectal examinations were normal. Pertinent  ?     negatives include normal sphincter tone and no palpable rectal lesions. ?     The entire examined colon appeared normal. ?     Non-bleeding external hemorrhoids were found during retroflexion. The  ?     hemorrhoids were small. ?     The terminal ileum appeared normal. ?Impression:            - The entire examined colon is normal. ?                       - Non-bleeding external hemorrhoids. ?                       - The examined portion of the ileum was normal. ?                       - No specimens collected. ?Recommendation:        - Discharge patient to home (with escort). ?                       - Resume previous diet today. ?                       - Continue present medications. ?                       - Repeat colonoscopy in  10 years for screening  ?                       purposes. ?Procedure Code(s):     --- Professional --- ?                       Z6109, Colorectal cancer screening; colonoscopy on  ?                       individual not meeting criteria for high risk ?Diagnosis Code(s):     --- Professional --- ?                       Z12.11, Encounter for screening for malignant neoplasm  ?                       of colon ?                       K64.4, Residual hemorrhoidal skin tags ?CPT copyright 2019 American Medical Association. All rights reserved. ?The codes documented in this report are preliminary and upon coder review may  ?be revised to meet current compliance requirements. ?Dr. Libby Maw ?Toney Reil MD, MD ?08/03/2021 8:38:28 AM ?This report has been signed electronically. ?Number of Addenda: 0 ?Note Initiated On: 08/03/2021 8:06 AM ?Scope Withdrawal Time: 0 hours 12 minutes 42 seconds  ?Total Procedure Duration: 0 hours 16 minutes 46  seconds  ?Estimated Blood Loss:  Estimated blood loss: none. Estimated blood loss: none. ?     [facility] ?

## 2021-08-03 NOTE — Transfer of Care (Signed)
Immediate Anesthesia Transfer of Care Note ? ?Patient: Yvonne HawsSharon H Young ? ?Procedure(s) Performed: COLONOSCOPY WITH PROPOFOL ? ?Patient Location: Endoscopy Unit ? ?Anesthesia Type:General ? ?Level of Consciousness: drowsy ? ?Airway & Oxygen Therapy: Patient Spontanous Breathing ? ?Post-op Assessment: Report given to RN and Post -op Vital signs reviewed and stable ? ?Post vital signs: Reviewed and stable ? ?Last Vitals:  ?Vitals Value Taken Time  ?BP 95/71 08/03/21 0838  ?Temp 36 ?C 08/03/21 0838  ?Pulse 85 08/03/21 0838  ?Resp 17 08/03/21 0838  ?SpO2 97 % 08/03/21 0838  ?Vitals shown include unvalidated device data. ? ?Last Pain:  ?Vitals:  ? 08/03/21 0838  ?TempSrc: Tympanic  ?PainSc: Asleep  ?   ? ?  ? ?Complications: No notable events documented. ?

## 2021-08-03 NOTE — Anesthesia Preprocedure Evaluation (Signed)
Anesthesia Evaluation  ?Patient identified by MRN, date of birth, ID band ?Patient awake ? ? ? ?Reviewed: ?Allergy & Precautions, H&P , NPO status , Patient's Chart, lab work & pertinent test results, reviewed documented beta blocker date and time  ? ?Airway ?Mallampati: II ? ? ?Neck ROM: full ? ? ? Dental ? ?(+) Poor Dentition ?  ?Pulmonary ?neg pulmonary ROS,  ?  ?Pulmonary exam normal ? ? ? ? ? ? ? Cardiovascular ?Exercise Tolerance: Good ?negative cardio ROS ?Normal cardiovascular exam ?Rhythm:regular Rate:Normal ? ? ?  ?Neuro/Psych ?negative neurological ROS ? negative psych ROS  ? GI/Hepatic ?negative GI ROS, Neg liver ROS,   ?Endo/Other  ?negative endocrine ROS ? Renal/GU ?negative Renal ROS  ?negative genitourinary ?  ?Musculoskeletal ? ? Abdominal ?  ?Peds ? Hematology ?negative hematology ROS ?(+)   ?Anesthesia Other Findings ?Past Medical History: ?No date: AR (allergic rhinitis) ?No date: Fibrocystic breast ?No date: Osteopenia ?No date: Postsurgical menopause ?No date: RA (rheumatoid arthritis) ([place]) ?    Comment:  Dr. Jefm Bryant ?No date: Rheumatoid arthritis ([place]) ?No date: Vitamin D deficiency ?Past Surgical History: ?05/31/1997: ABDOMINAL HYSTERECTOMY ?    Comment:  w/o bilateral oophorectomy ?No date: COLONOSCOPY ?BMI   ? Body Mass Index: 23.54 kg/m?  ?  ? Reproductive/Obstetrics ?negative OB ROS ? ?  ? ? ? ? ? ? ? ? ? ? ? ? ? ?  ?  ? ? ? ? ? ? ? ? ?Anesthesia Physical ?Anesthesia Plan ? ?ASA: 2 ? ?Anesthesia Plan: General  ? ?Post-op Pain Management:   ? ?Induction:  ? ?PONV Risk Score and Plan:  ? ?Airway Management Planned:  ? ?Additional Equipment:  ? ?Intra-op Plan:  ? ?Post-operative Plan:  ? ?Informed Consent: I have reviewed the patients History and Physical, chart, labs and discussed the procedure including the risks, benefits and alternatives for the proposed anesthesia with the patient or authorized representative who has indicated his/her understanding  and acceptance.  ? ? ? ?Dental Advisory Given ? ?Plan Discussed with: CRNA ? ?Anesthesia Plan Comments:   ? ? ? ? ? ? ?Anesthesia Quick Evaluation ? ?

## 2021-08-04 ENCOUNTER — Encounter: Payer: Self-pay | Admitting: Gastroenterology

## 2021-08-14 IMAGING — MG MM DIGITAL DIAGNOSTIC UNILAT*L* W/ TOMO W/ CAD
6 of 10 series · 6 of 30 positions shown · non-contrast
Comparison: Previous exam(s).

CLINICAL DATA: Patient was recalled from screening mammogram for a
left breast mass.

EXAM:
DIGITAL DIAGNOSTIC UNILATERAL LEFT MAMMOGRAM WITH TOMOSYNTHESIS AND
CAD; ULTRASOUND LEFT BREAST LIMITED
TECHNIQUE: Left digital diagnostic mammography and breast tomosynthesis was
performed. The images were evaluated with computer-aided detection.;
Targeted ultrasound examination of the left breast was performed

[L XCCL synth-2D]
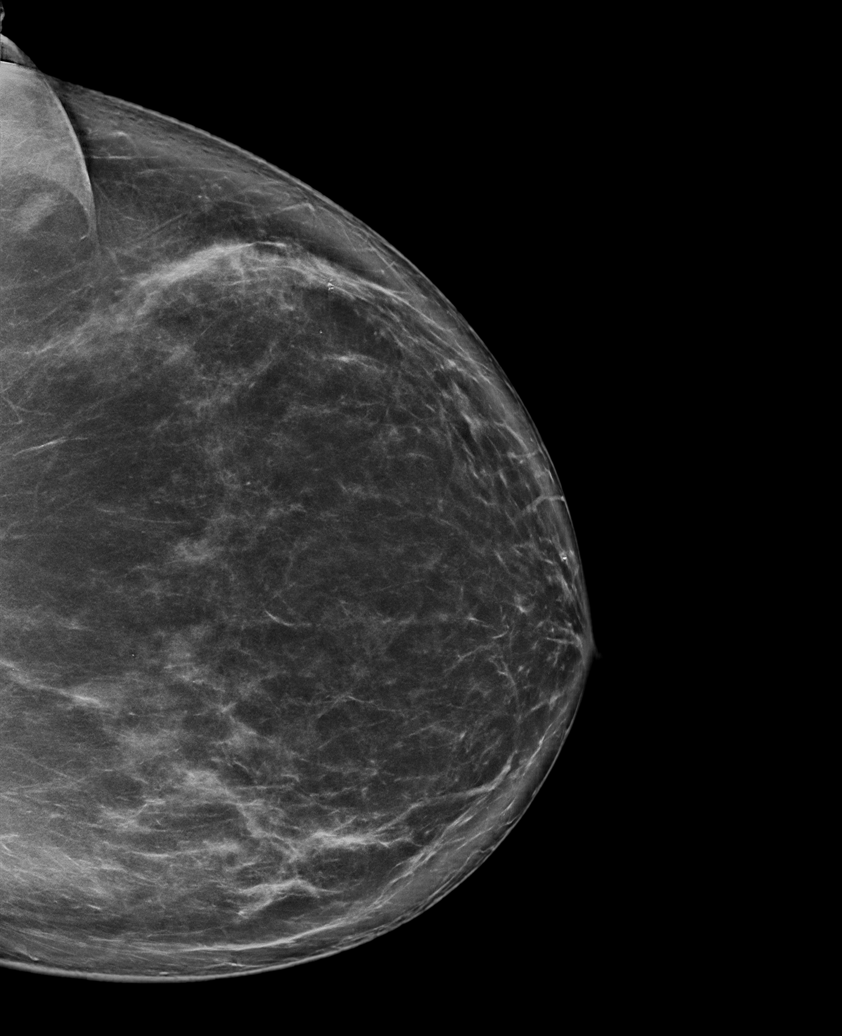

[L MLO synth-2D]
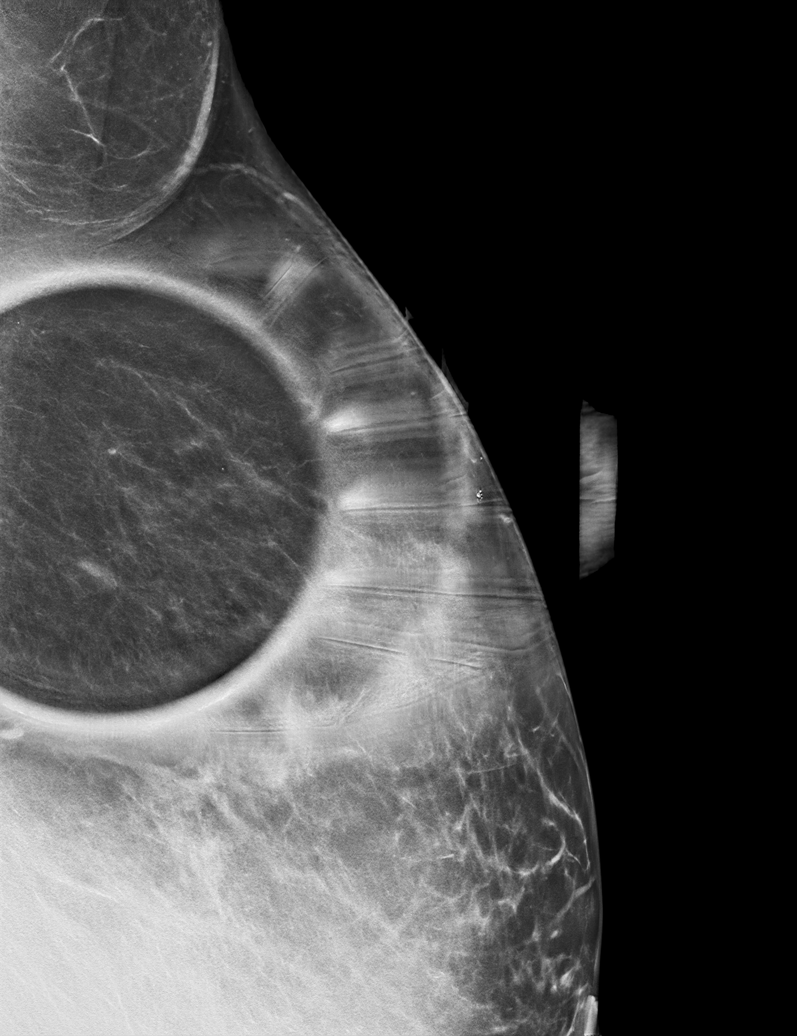

[L ML synth-2D (1 of 2)]
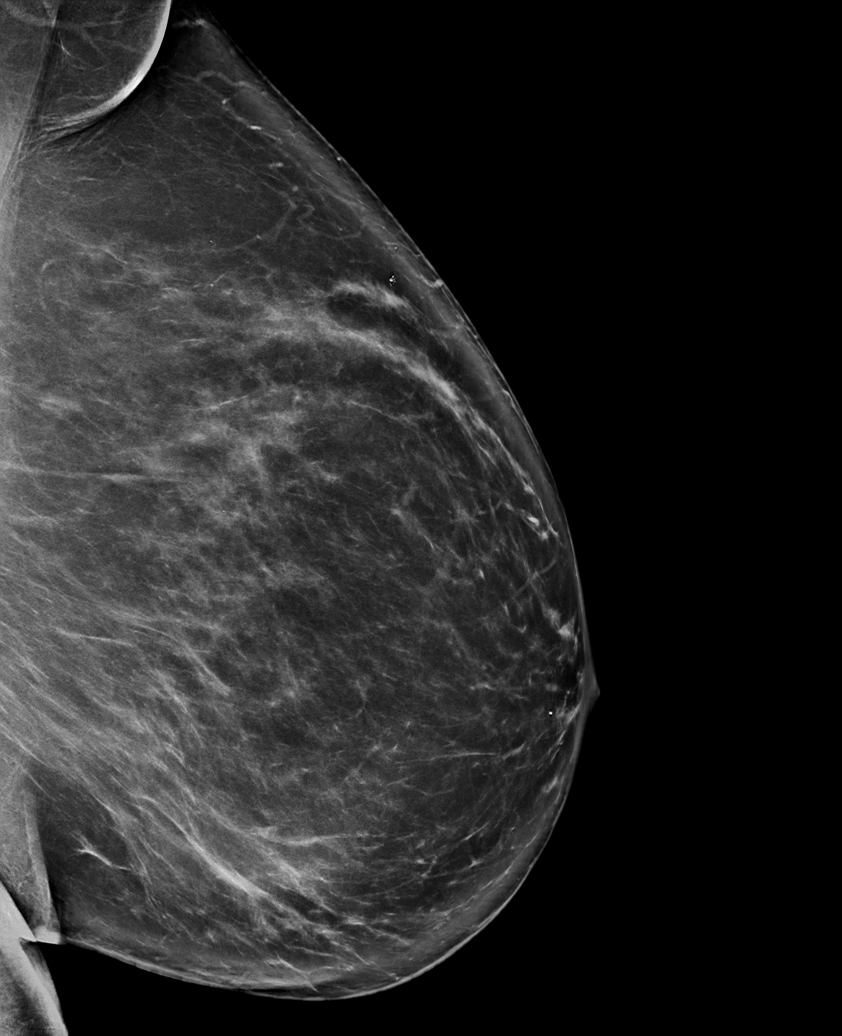

[L ML synth-2D (2 of 2)]
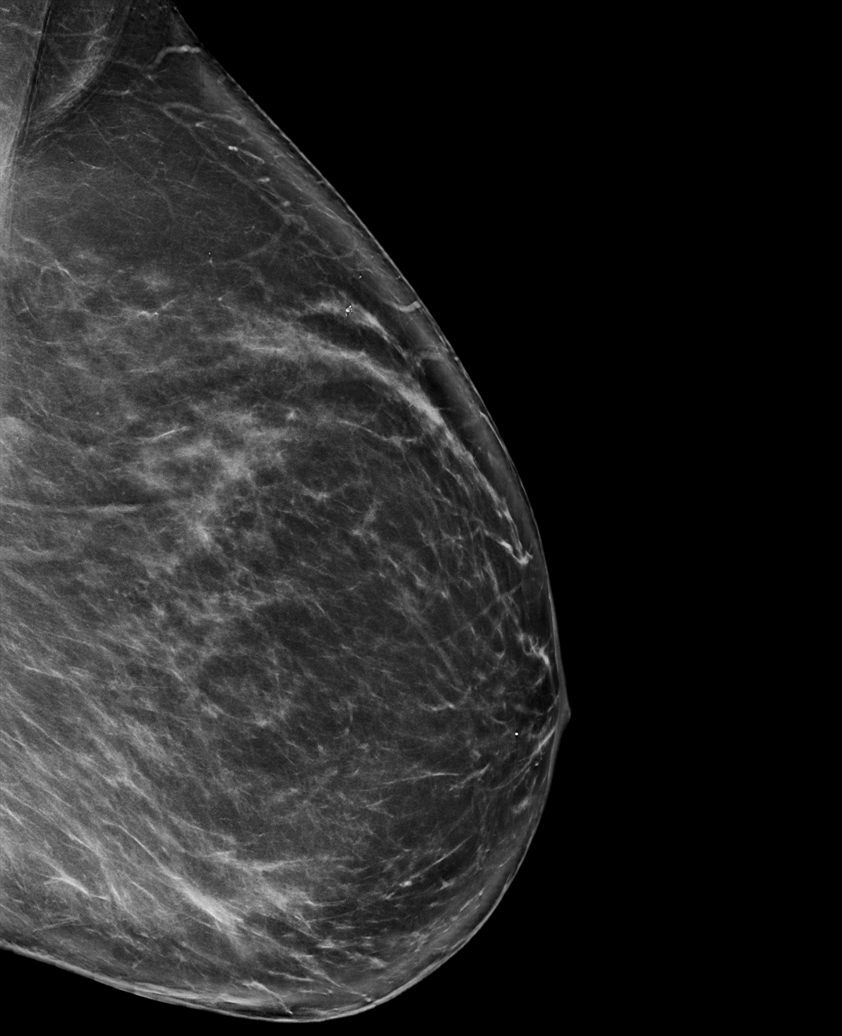

[L CC synth-2D]
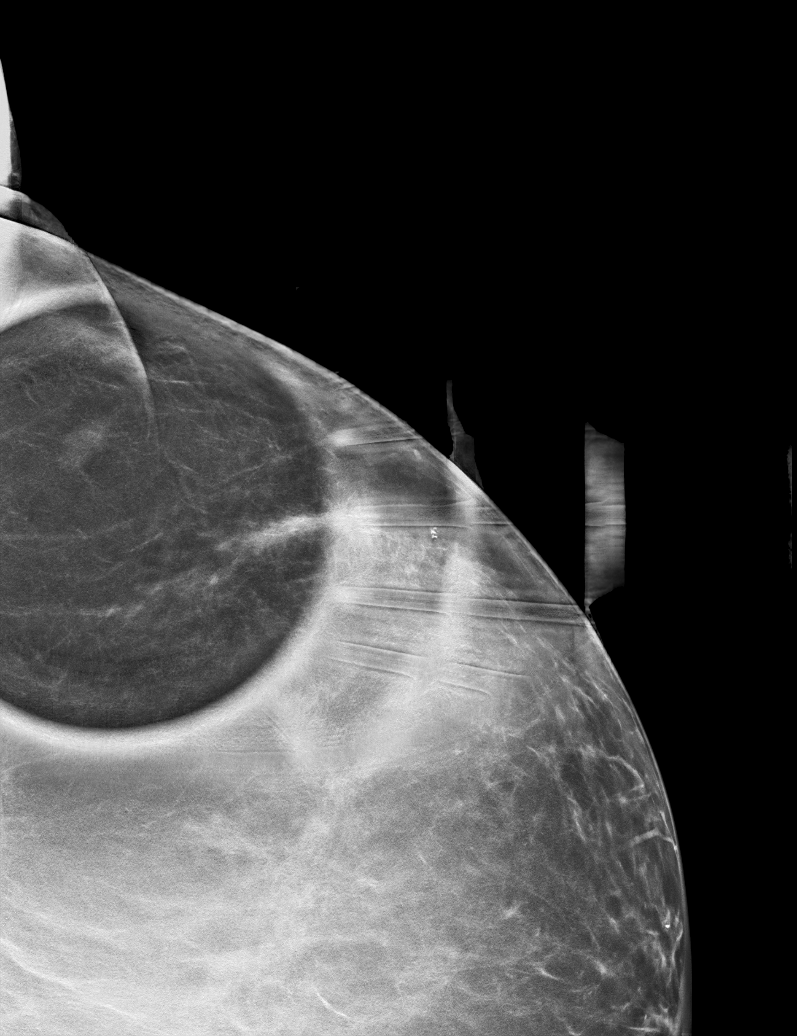

[L ML tomo · tomo slice 50/99.0]
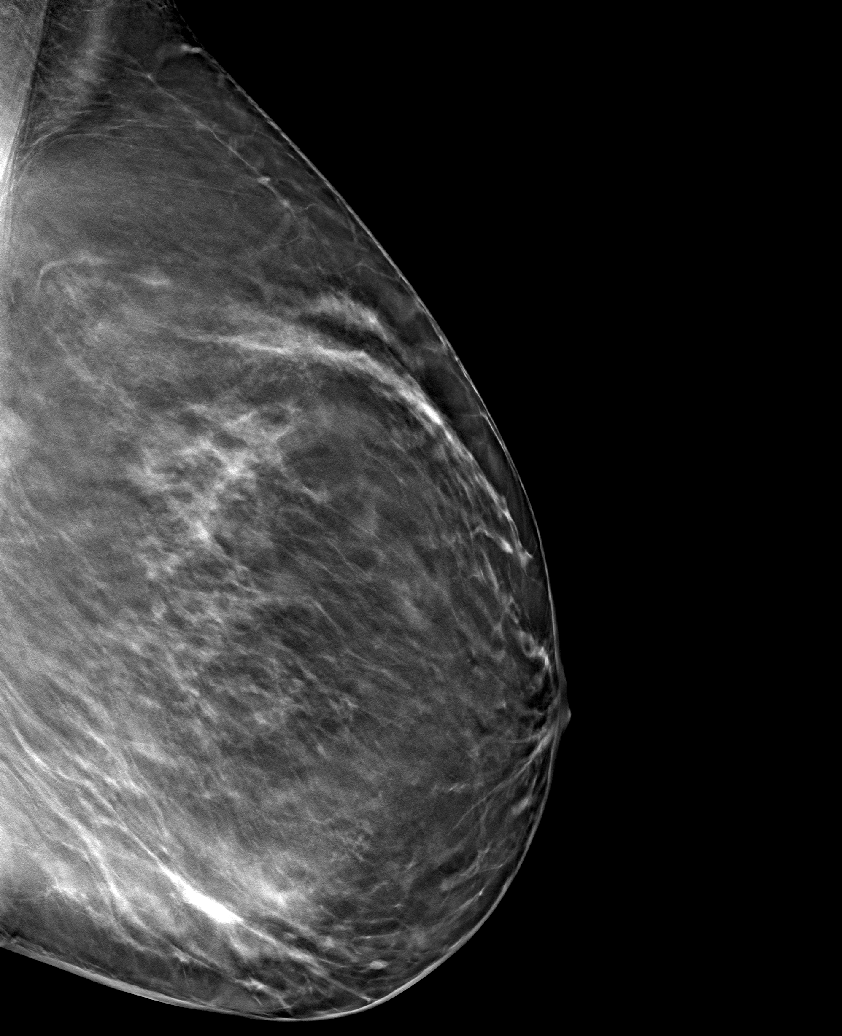

[6 of 30 positions shown; findings below may reference images not displayed]

ACR Breast Density Category b: There are scattered areas of
fibroglandular density.
FINDINGS: Additional imaging of the left breast was performed. There is
persistence of a 1 cm mass in the upper-outer quadrant of the
breast.

On physical exam, I do not palpate a mass in the upper-outer
quadrant of the breast.

Targeted ultrasound is performed, showing a well-circumscribed
hypoechoic mass with central hyperechogenicity deep in the lower
left axilla at 1 o'clock 10 cm from the nipple. It measures 1.1 x
1.0 x 0.6 cm. It is likely a benign lymph node. There is a benign
lymph node in the left breast at 2 o'clock 10 cm from the nipple
measuring 0.8 x 0.6 x 0.8 cm.
IMPRESSION: Probable benign lymph nodes in the 1 o'clock region of the left
breast 10 cm from the nipple and at 2 o'clock 10 cm from the nipple.
1 of these likely accounts for the abnormality on the mammogram.

RECOMMENDATION:
Short-term interval follow-up left breast ultrasound in 3 months is
recommended.

I have discussed the findings and recommendations with the patient.
If applicable, a reminder letter will be sent to the patient
regarding the next appointment.

BI-RADS CATEGORY  3: Probably benign.

## 2021-10-13 NOTE — Progress Notes (Signed)
Name: Yvonne Young   MRN: [id number]    DOB: 10-Nov-1960   Date:10/14/2021 ? ?     Progress Note ? ?Subjective ? ?Chief Complaint ? ?Chief Complaint  ?Patient presents with  ? Annual Exam  ? ? ?HPI ? ?Patient presents for annual CPE. ? ?Diet: balanced  ?Exercise: busy carrying for two households - mother is 61 yo , discussed regular physical activity   ? ?[place] Office Visit from 10/14/2021 in [facility]  ?AUDIT-C Score 0  ? ?  ? ?Depression: Phq 9 is  negative ? ?  10/14/2021  ?  7:48 AM 04/01/2021  ?  7:44 AM 10/08/2020  ?  7:39 AM 08/12/2020  ?  3:13 PM 10/03/2019  ?  7:51 AM  ?Depression screen PHQ 2/9  ?Decreased Interest 0 0 0 0 0  ?Down, Depressed, Hopeless 0 0 0 0 0  ?PHQ - 2 Score 0 0 0 0 0  ?Altered sleeping 0 0   0  ?Tired, decreased energy 0 0   0  ?Change in appetite 0 0   0  ?Feeling bad or failure about yourself  0 0   0  ?Trouble concentrating 0 0   0  ?Moving slowly or fidgety/restless 0 0   0  ?Suicidal thoughts 0 0   0  ?PHQ-9 Score 0 0   0  ?Difficult doing work/chores     Not difficult at all  ? ?Hypertension: ?BP Readings from Last 3 Encounters:  ?10/14/21 112/72  ?08/03/21 113/84  ?04/01/21 116/70  ? ?Obesity: ?Wt Readings from Last 3 Encounters:  ?10/14/21 140 lb 11.2 oz (63.8 kg)  ?08/03/21 135 lb (61.2 kg)  ?04/01/21 143 lb (64.9 kg)  ? ?BMI Readings from Last 3 Encounters:  ?10/14/21 24.53 kg/m?  ?08/03/21 23.54 kg/m?  ?04/01/21 24.55 kg/m?  ?  ? ?Vaccines:  ? ?Tdap: up to date  ?Shingrix: up to date  ?Pneumonia: up to date  ?Flu: up to date  ?COVID-19: up to date  ? ? ?Hep C Screening: 2020 ?STD testing and prevention (HIV/chl/gon/syphilis): today  ?Intimate partner violence: negative screen  ?Sexual History :no pain or vaginal dryness  - one sexual partner  ?Menstrual History/LMP/Abnormal Bleeding: s/p hysterectomy  ?Discussed importance of follow up if any post-menopausal bleeding: not applicable  ?Incontinence Symptoms: negative for symptoms  ? ?Breast cancer:  ?-  Last Mammogram: 06/22 ?- BRCA gene screening: NA ? ?Osteoporosis Prevention : Discussed high calcium and vitamin D supplementation, weight bearing exercises ?Bone density :yes - we will recheck this year ? ?Cervical cancer screening: N/A ? ?Skin cancer: Discussed monitoring for atypical lesions  ?Colorectal cancer: up to date, repeat in 10 2033   ?Lung cancer:  Low Dose CT Chest recommended if Age 73-80 years, 20 pack-year currently smoking OR have quit w/in 15years. Patient does not qualify for screen   ?ECG: 09/21/2017 ? ?Advanced Care Planning: A voluntary discussion about advance care planning including the explanation and discussion of advance directives.  Discussed health care proxy and Living will, and the patient was able to identify a health care proxy as sister - Santiago Glad .  Patient does not have a living will and power of attorney of health care  ? ?Lipids: ?Lab Results  ?Component Value Date  ? CHOL 242 (H) 10/08/2020  ? CHOL 239 (H) 10/03/2019  ? CHOL 239 (H) 09/27/2018  ? ?Lab Results  ?Component Value Date  ? HDL 60 10/08/2020  ? HDL 53 10/03/2019  ?  HDL 57 09/27/2018  ? ?Lab Results  ?Component Value Date  ? LDLCALC 163 (H) 10/08/2020  ? LDLCALC 167 (H) 10/03/2019  ? LDLCALC 162 (H) 09/27/2018  ? ?Lab Results  ?Component Value Date  ? TRIG 84 10/08/2020  ? TRIG 85 10/03/2019  ? TRIG 94 09/27/2018  ? ?Lab Results  ?Component Value Date  ? CHOLHDL 4.0 10/08/2020  ? CHOLHDL 4.5 10/03/2019  ? CHOLHDL 4.2 09/27/2018  ? ?No results found for: LDLDIRECT ? ?Glucose: ?Glucose, Bld  ?Date Value Ref Range Status  ?10/03/2019 87 65 - 99 mg/dL Final  ?  Comment:  ?  . ?           Fasting reference interval ?. ?  ?09/17/2016 70 65 - 99 mg/dL Final  ? ? ?Patient Active Problem List  ? Diagnosis Date Noted  ? Pure hypercholesterolemia 04/01/2021  ? Osteopenia after menopause 04/01/2021  ? Pre-diabetes 04/01/2021  ? Sinus bradycardia 09/21/2017  ? Fibrocystic breast disease 07/01/2015  ? History of iron deficiency  07/01/2015  ? Allergic rhinitis, seasonal 07/01/2015  ? Vitamin D deficiency 07/01/2015  ? Seropositive rheumatoid arthritis ([place]) 03/11/2014  ? ? ?Past Surgical History:  ?Procedure Laterality Date  ? ABDOMINAL HYSTERECTOMY  05/31/1997  ? w/o bilateral oophorectomy  ? COLONOSCOPY    ? COLONOSCOPY WITH PROPOFOL N/A 08/03/2021  ? Procedure: COLONOSCOPY WITH PROPOFOL;  Surgeon: Lin Landsman, MD;  [facility] ENDOSCOPY;  Service: Gastroenterology;  Laterality: N/A;  ? ? ?Family History  ?Problem Relation Age of Onset  ? Hypothyroidism Mother   ? Hypertension Mother   ? Breast cancer Neg Hx   ? ? ?Social History  ? ?Socioeconomic History  ? Marital status: Single  ?  Spouse name: Not on file  ? Number of children: 1  ? Years of education: 23  ? Highest education level: High school graduate  ?Occupational History  ? Occupation: provider template services  ?  Employer: [employer]  ? Occupation: Therapist, art  ?  Employer: [id number]  ?Tobacco Use  ? Smoking status: Never  ? Smokeless tobacco: Never  ?Vaping Use  ? Vaping Use: Never used  ?Substance and Sexual Activity  ? Alcohol use: No  ?  Alcohol/week: 0.0 standard drinks  ? Drug use: No  ? Sexual activity: Yes  ?  Partners: Male  ?Other Topics Concern  ? Not on file  ?Social History Narrative  ? Lives by herself, dating, she was married at age 25 and divorced at age 54 , re-married at age 36 and widow after one year.   ? She has two jobs - works for [employer] ( remote ) and also at [employer]  ? ?Social Determinants of Health  ? ?Financial Resource Strain: Low Risk   ? Difficulty of Paying Living Expenses: Not hard at all  ?Food Insecurity: No Food Insecurity  ? Worried About Charity fundraiser in the Last Year: Never true  ? Ran Out of Food in the Last Year: Never true  ?Transportation Needs: No Transportation Needs  ? Lack of Transportation (Medical): No  ? Lack of Transportation (Non-Medical): No  ?Physical Activity: Inactive  ? Days of Exercise  per Week: 0 days  ? Minutes of Exercise per Session: 0 min  ?Stress: No Stress Concern Present  ? Feeling of Stress : Only a little  ?Social Connections: Moderately Integrated  ? Frequency of Communication with Friends and Family: Three times a week  ? Frequency of Social Gatherings  with Friends and Family: Twice a week  ? Attends Religious Services: More than 4 times per year  ? Active Member of Clubs or Organizations: No  ? Attends Archivist Meetings: More than 4 times per year  ? Marital Status: Divorced  ?Intimate Partner Violence: Not At Risk  ? Fear of Current or Ex-Partner: No  ? Emotionally Abused: No  ? Physically Abused: No  ? Sexually Abused: No  ? ? ? ?Current Outpatient Medications:  ?  Cholecalciferol (VITAMIN D3) 50 MCG (2000 UT) CAPS, Take 1 capsule by mouth daily., Disp: , Rfl:  ?  etanercept (ENBREL) 50 MG/ML injection, Inject into the skin., Disp: , Rfl:  ?  methotrexate (RHEUMATREX) 2.5 MG tablet, Take 4 tablets (10 mg total) by mouth once a week., Disp: 16 tablet, Rfl: 0 ? ?Allergies  ?Allergen Reactions  ? Amoxicillin Nausea Only  ?  Had nausea for 3 days, and this stopped when she discontinued the medication.  ? ? ? ?ROS ? ?Constitutional: Negative for fever or weight change.  ?Respiratory: Negative for cough and shortness of breath.   ?Cardiovascular: Negative for chest pain or palpitations.  ?Gastrointestinal: Negative for abdominal pain, no bowel changes.  ?Musculoskeletal: Negative for gait problem or joint swelling.  ?Skin: Negative for rash.  ?Neurological: Negative for dizziness or headache.  ?No other specific complaints in a complete review of systems (except as listed in HPI above).  ? ?Objective ? ?Vitals:  ? 10/14/21 0745  ?BP: 112/72  ?Pulse: 72  ?Resp: 14  ?Temp: 97.9 ?F (36.6 ?C)  ?TempSrc: Oral  ?SpO2: 100%  ?Weight: 140 lb 11.2 oz (63.8 kg)  ?Height: 5' 3.5" (1.613 m)  ? ? ?Body mass index is 24.53 kg/m?. ? ?Physical Exam ? ?Constitutional: Patient appears  well-developed and well-nourished. No distress.  ?HENT: Head: Normocephalic and atraumatic. Ears: B TMs ok, no erythema or effusion; Nose: Nose normal. Mouth/Throat: Oropharynx is clear and moist. No oropharyngeal ex

## 2021-10-14 ENCOUNTER — Encounter: Payer: Self-pay | Admitting: Family Medicine

## 2021-10-14 ENCOUNTER — Ambulatory Visit (INDEPENDENT_AMBULATORY_CARE_PROVIDER_SITE_OTHER): Payer: BC Managed Care – PPO | Admitting: Family Medicine

## 2021-10-14 VITALS — BP 112/72 | HR 72 | Temp 97.9°F | Resp 14 | Ht 63.5 in | Wt 140.7 lb

## 2021-10-14 DIAGNOSIS — E559 Vitamin D deficiency, unspecified: Secondary | ICD-10-CM | POA: Diagnosis not present

## 2021-10-14 DIAGNOSIS — E7800 Pure hypercholesterolemia, unspecified: Secondary | ICD-10-CM

## 2021-10-14 DIAGNOSIS — Z780 Asymptomatic menopausal state: Secondary | ICD-10-CM

## 2021-10-14 DIAGNOSIS — Z79899 Other long term (current) drug therapy: Secondary | ICD-10-CM | POA: Diagnosis not present

## 2021-10-14 DIAGNOSIS — M8580 Other specified disorders of bone density and structure, unspecified site: Secondary | ICD-10-CM | POA: Diagnosis not present

## 2021-10-14 DIAGNOSIS — M059 Rheumatoid arthritis with rheumatoid factor, unspecified: Secondary | ICD-10-CM | POA: Diagnosis not present

## 2021-10-14 DIAGNOSIS — Z0000 Encounter for general adult medical examination without abnormal findings: Secondary | ICD-10-CM

## 2021-10-14 DIAGNOSIS — Z113 Encounter for screening for infections with a predominantly sexual mode of transmission: Secondary | ICD-10-CM | POA: Diagnosis not present

## 2021-10-14 DIAGNOSIS — R7303 Prediabetes: Secondary | ICD-10-CM

## 2021-10-14 DIAGNOSIS — Z1231 Encounter for screening mammogram for malignant neoplasm of breast: Secondary | ICD-10-CM

## 2021-10-15 LAB — LIPID PANEL
Cholesterol: 269 mg/dL — ABNORMAL HIGH (ref <200)
HDL: 54 mg/dL (ref >=50)
LDL Cholesterol (Calc): 198 mg/dL (calc) — ABNORMAL HIGH
Non-HDL Cholesterol (Calc): 215 mg/dL (calc) — ABNORMAL HIGH (ref <130)
Total CHOL/HDL Ratio: 5 (calc) — ABNORMAL HIGH (ref <5.0)
Triglycerides: 73 mg/dL (ref <150)

## 2021-10-15 LAB — COMPLETE METABOLIC PANEL WITH GFR
AG Ratio: 1.3 (calc) (ref 1.0–2.5)
ALT: 24 U/L (ref 6–29)
AST: 27 U/L (ref 10–35)
Albumin: 4.4 g/dL (ref 3.6–5.1)
Alkaline phosphatase (APISO): 84 U/L (ref 37–153)
BUN: 12 mg/dL (ref 7–25)
CO2: 25 mmol/L (ref 20–32)
Calcium: 9.5 mg/dL (ref 8.6–10.4)
Chloride: 105 mmol/L (ref 98–110)
Creat: 0.79 mg/dL (ref 0.50–1.05)
Globulin: 3.4 g/dL (calc) (ref 1.9–3.7)
Glucose, Bld: 82 mg/dL (ref 65–99)
Potassium: 4.1 mmol/L (ref 3.5–5.3)
Sodium: 140 mmol/L (ref 135–146)
Total Bilirubin: 0.4 mg/dL (ref 0.2–1.2)
Total Protein: 7.8 g/dL (ref 6.1–8.1)
eGFR: 86 mL/min/{1.73_m2} (ref >=60)

## 2021-10-15 LAB — CBC WITH DIFFERENTIAL/PLATELET
Absolute Monocytes: 585 cells/uL (ref 200–950)
Basophils Absolute: 48 cells/uL (ref 0–200)
Basophils Relative: 0.7 %
Eosinophils Absolute: 68 cells/uL (ref 15–500)
Eosinophils Relative: 1 %
HCT: 42.1 % (ref 35.0–45.0)
Hemoglobin: 14 g/dL (ref 11.7–15.5)
Lymphs Abs: 2455 cells/uL (ref 850–3900)
MCH: 29.1 pg (ref 27.0–33.0)
MCHC: 33.3 g/dL (ref 32.0–36.0)
MCV: 87.5 fL (ref 80.0–100.0)
MPV: 11.4 fL (ref 7.5–12.5)
Monocytes Relative: 8.6 %
Neutro Abs: 3645 cells/uL (ref 1500–7800)
Neutrophils Relative %: 53.6 %
Platelets: 227 10*3/uL (ref 140–400)
RBC: 4.81 10*6/uL (ref 3.80–5.10)
RDW: 13.2 % (ref 11.0–15.0)
Total Lymphocyte: 36.1 %
WBC: 6.8 10*3/uL (ref 3.8–10.8)

## 2021-10-15 LAB — HEMOGLOBIN A1C
Hgb A1c MFr Bld: 5.6 % of total Hgb (ref <5.7)
Mean Plasma Glucose: 114 mg/dL
eAG (mmol/L): 6.3 mmol/L

## 2021-10-15 LAB — VITAMIN D 25 HYDROXY (VIT D DEFICIENCY, FRACTURES): Vit D, 25-Hydroxy: 39 ng/mL (ref 30–100)

## 2021-10-15 LAB — HIV ANTIBODY (ROUTINE TESTING W REFLEX): HIV 1&2 Ab, 4th Generation: NONREACTIVE

## 2021-10-15 LAB — RPR: RPR Ser Ql: NONREACTIVE

## 2021-12-10 DIAGNOSIS — M06842 Other specified rheumatoid arthritis, left hand: Secondary | ICD-10-CM | POA: Diagnosis not present

## 2021-12-10 DIAGNOSIS — M19041 Primary osteoarthritis, right hand: Secondary | ICD-10-CM | POA: Diagnosis not present

## 2021-12-10 DIAGNOSIS — M06841 Other specified rheumatoid arthritis, right hand: Secondary | ICD-10-CM | POA: Diagnosis not present

## 2021-12-10 DIAGNOSIS — M0579 Rheumatoid arthritis with rheumatoid factor of multiple sites without organ or systems involvement: Secondary | ICD-10-CM | POA: Diagnosis not present

## 2021-12-10 DIAGNOSIS — Z79899 Other long term (current) drug therapy: Secondary | ICD-10-CM | POA: Diagnosis not present

## 2021-12-15 ENCOUNTER — Ambulatory Visit
Admission: RE | Admit: 2021-12-15 | Discharge: 2021-12-15 | Disposition: A | Discharge status: Home / Self Care | Payer: BC Managed Care – PPO | Source: Ambulatory Visit | Attending: Family Medicine | Admitting: Family Medicine

## 2021-12-15 DIAGNOSIS — Z780 Asymptomatic menopausal state: Secondary | ICD-10-CM | POA: Insufficient documentation

## 2021-12-15 DIAGNOSIS — Z1231 Encounter for screening mammogram for malignant neoplasm of breast: Secondary | ICD-10-CM | POA: Diagnosis not present

## 2021-12-15 DIAGNOSIS — M8580 Other specified disorders of bone density and structure, unspecified site: Secondary | ICD-10-CM | POA: Insufficient documentation

## 2021-12-15 DIAGNOSIS — M8589 Other specified disorders of bone density and structure, multiple sites: Secondary | ICD-10-CM | POA: Diagnosis not present

## 2022-02-03 ENCOUNTER — Ambulatory Visit (INDEPENDENT_AMBULATORY_CARE_PROVIDER_SITE_OTHER): Payer: BC Managed Care – PPO

## 2022-02-03 DIAGNOSIS — Z23 Encounter for immunization: Secondary | ICD-10-CM | POA: Diagnosis not present

## 2022-03-31 DIAGNOSIS — M0579 Rheumatoid arthritis with rheumatoid factor of multiple sites without organ or systems involvement: Secondary | ICD-10-CM | POA: Diagnosis not present

## 2022-03-31 DIAGNOSIS — Z79899 Other long term (current) drug therapy: Secondary | ICD-10-CM | POA: Diagnosis not present

## 2022-08-04 DIAGNOSIS — M0579 Rheumatoid arthritis with rheumatoid factor of multiple sites without organ or systems involvement: Secondary | ICD-10-CM | POA: Diagnosis not present

## 2022-08-04 DIAGNOSIS — Z79899 Other long term (current) drug therapy: Secondary | ICD-10-CM | POA: Diagnosis not present

## 2022-10-19 NOTE — Progress Notes (Unsigned)
Name: Yvonne Young   MRN: [id number]    DOB: 07/01/1960   Date:10/20/2022       Progress Note  Subjective  Chief Complaint  Annual Exam  HPI  Patient presents for annual CPE.  Diet: balanced  Exercise: discussed 150 minutes per week   Last Eye Exam: up to date  Last Dental Exam: up to date   [employer] Visit from 10/20/2022 in [facility]  AUDIT-C Score 0      Depression: Phq 9 is  negative    10/20/2022    7:38 AM 10/14/2021    7:48 AM 04/01/2021    7:44 AM 10/08/2020    7:39 AM 08/12/2020    3:13 PM  Depression screen PHQ 2/9  Decreased Interest 0 0 0 0 0  Down, Depressed, Hopeless 0 0 0 0 0  PHQ - 2 Score 0 0 0 0 0  Altered sleeping 0 0 0    Tired, decreased energy 0 0 0    Change in appetite 0 0 0    Feeling bad or failure about yourself  0 0 0    Trouble concentrating 0 0 0    Moving slowly or fidgety/restless 0 0 0    Suicidal thoughts 0 0 0    PHQ-9 Score 0 0 0     Hypertension: BP Readings from Last 3 Encounters:  10/20/22 126/68  10/14/21 112/72  08/03/21 113/84   Obesity: Wt Readings from Last 3 Encounters:  10/20/22 140 lb (63.5 kg)  10/14/21 140 lb 11.2 oz (63.8 kg)  08/03/21 135 lb (61.2 kg)   BMI Readings from Last 3 Encounters:  10/20/22 24.80 kg/m  10/14/21 24.53 kg/m  08/03/21 23.54 kg/m     Vaccines:   Tdap: up to date Shingrix: up to date Pneumonia: up to date Flu: up to date COVID-19: up to date   Hep C Screening: 09/27/18 STD testing and prevention (HIV/chl/gon/syphilis): 10/14/21 Intimate partner violence: negative screen  Sexual History : no pain - one partner  Menstrual History/LMP/Abnormal Bleeding: s/p hysterectomy  Discussed importance of follow up if any post-menopausal bleeding: yes  Incontinence Symptoms: negative for symptoms   Breast cancer:  - Last Mammogram: 12/15/21 - BRCA gene screening: N/A  Osteoporosis Prevention : Discussed high calcium and vitamin D  supplementation, weight bearing exercises Bone density: 12/15/21 - discussed results osteopenia   Cervical cancer screening: N/A  Skin cancer: Discussed monitoring for atypical lesions  Colorectal cancer: 08/03/21   Lung cancer:  Low Dose CT Chest recommended if Age 31-80 years, 20 pack-year currently smoking OR have quit w/in 15years. Patient does not qualify for screen   ECG: 09/21/17  Advanced Care Planning: A voluntary discussion about advance care planning including the explanation and discussion of advance directives.  Discussed health care proxy and Living will, and the patient was able to identify a health care proxy as sister - Greer Ee   .  Patient does not have a living will and power of attorney of health care   Lipids: Lab Results  Component Value Date   CHOL 269 (H) 10/14/2021   CHOL 242 (H) 10/08/2020   CHOL 239 (H) 10/03/2019   Lab Results  Component Value Date   HDL 54 10/14/2021   HDL 60 10/08/2020   HDL 53 10/03/2019   Lab Results  Component Value Date   LDLCALC 198 (H) 10/14/2021   LDLCALC 163 (H) 10/08/2020   LDLCALC 167 (H) 10/03/2019  Lab Results  Component Value Date   TRIG 73 10/14/2021   TRIG 84 10/08/2020   TRIG 85 10/03/2019   Lab Results  Component Value Date   CHOLHDL 5.0 (H) 10/14/2021   CHOLHDL 4.0 10/08/2020   CHOLHDL 4.5 10/03/2019   No results found for: "LDLDIRECT"  Glucose: Glucose, Bld  Date Value Ref Range Status  10/14/2021 82 65 - 99 mg/dL Final    Comment:    .            Fasting reference interval .   10/03/2019 87 65 - 99 mg/dL Final    Comment:    .            Fasting reference interval .   09/17/2016 70 65 - 99 mg/dL Final    Patient Active Problem List   Diagnosis Date Noted   Pure hypercholesterolemia 04/01/2021   Osteopenia after menopause 04/01/2021   Pre-diabetes 04/01/2021   Sinus bradycardia 09/21/2017   Fibrocystic breast disease 07/01/2015   History of iron deficiency 07/01/2015   Allergic  rhinitis, seasonal 07/01/2015   Vitamin D deficiency 07/01/2015   Seropositive rheumatoid arthritis (HCC) 03/11/2014    Past Surgical History:  Procedure Laterality Date   ABDOMINAL HYSTERECTOMY  05/31/1997   w/o bilateral oophorectomy   COLONOSCOPY     COLONOSCOPY WITH PROPOFOL N/A 08/03/2021   Procedure: COLONOSCOPY WITH PROPOFOL;  Surgeon: Toney Reil, MD;  Location: [facility] ENDOSCOPY;  Service: Gastroenterology;  Laterality: N/A;    Family History  Problem Relation Age of Onset   Hypothyroidism Mother    Hypertension Mother    Breast cancer Neg Hx     Social History   Socioeconomic History   Marital status: Single    Spouse name: Not on file   Number of children: 1   Years of education: 78   Highest education level: High school graduate  Occupational History   Occupation: provider template services    Employer: [employer]   Occupation: Producer, television/film/video    Employer: [id number]  Tobacco Use   Smoking status: Never   Smokeless tobacco: Never  Vaping Use   Vaping Use: Never used  Substance and Sexual Activity   Alcohol use: No    Alcohol/week: 0.0 standard drinks of alcohol   Drug use: No   Sexual activity: Yes    Partners: Male  Other Topics Concern   Not on file  Social History Narrative   Lives by herself, dating, she was married at age 28 and divorced at age 43 , re-married at age 3 and widow after one year.    She has two jobs - works for [employer] ( remote ) and also at [employer]   Social Determinants of [employer]: Low Risk  (10/20/2022)   Overall Financial Resource Strain (CARDIA)    Difficulty of Paying Living Expenses: Not hard at all  Food Insecurity: No Food Insecurity (10/20/2022)   Hunger Vital Sign    Worried About Running Out of Food in the Last Year: Never true    Ran Out of Food in the Last Year: Never true  Transportation Needs: No Transportation Needs (10/20/2022)   PRAPARE - Scientist, research (physical sciences) (Medical): No    Lack of Transportation (Non-Medical): No  Physical Activity: Insufficiently Active (10/20/2022)   Exercise Vital Sign    Days of Exercise per Week: 4 days    Minutes of Exercise per Session: 30 min  Stress: No Stress Concern Present (10/20/2022)   [employer] of Occupational Health - Occupational Stress Questionnaire    Feeling of Stress : Only a little  Social Connections: Socially Integrated (10/20/2022)   Social Connection and Isolation Panel [NHANES]    Frequency of Communication with Friends and Family: Three times a week    Frequency of Social Gatherings with Friends and Family: Twice a week    Attends Religious Services: More than 4 times per year    Active Member of [employer] or Organizations: Yes    Attends Engineer, structural: More than 4 times per year    Marital Status: Living with partner  Intimate Partner Violence: Not At Risk (10/20/2022)   Humiliation, Afraid, Rape, and Kick questionnaire    Fear of Current or Ex-Partner: No    Emotionally Abused: No    Physically Abused: No    Sexually Abused: No     Current Outpatient Medications:    Cholecalciferol (VITAMIN D3) 50 MCG (2000 UT) CAPS, Take 1 capsule by mouth daily., Disp: , Rfl:    etanercept (ENBREL) 50 MG/ML injection, Inject into the skin., Disp: , Rfl:    methotrexate (RHEUMATREX) 2.5 MG tablet, Take 4 tablets (10 mg total) by mouth once a week., Disp: 16 tablet, Rfl: 0  Allergies  Allergen Reactions   Amoxicillin Nausea Only    Had nausea for 3 days, and this stopped when she discontinued the medication.     ROS  Constitutional: Negative for fever or weight change.  Respiratory: Negative for cough and shortness of breath.   Cardiovascular: Negative for chest pain or palpitations.  Gastrointestinal: Negative for abdominal pain, no bowel changes.  Musculoskeletal: Negative for gait problem or joint swelling.  Skin: Negative for rash.  Neurological: Negative for  dizziness or headache.  No other specific complaints in a complete review of systems (except as listed in HPI above).   Objective  Vitals:   10/20/22 0739  BP: 126/68  Pulse: 92  Resp: 16  SpO2: 100%  Weight: 140 lb (63.5 kg)  Height: 5\' 3"  (1.6 m)    Body mass index is 24.8 kg/m.  Physical Exam  Constitutional: Patient appears well-developed and well-nourished. No distress.  HENT: Head: Normocephalic and atraumatic. Ears: B TMs ok, no erythema or effusion; Nose: Nose normal. Mouth/Throat: Oropharynx is clear and moist. No oropharyngeal exudate.  Eyes: Conjunctivae and EOM are normal. Pupils are equal, round, and reactive to light. No scleral icterus.  Neck: Normal range of motion. Neck supple. No JVD present. No thyromegaly present.  Cardiovascular: Normal rate, regular rhythm and normal heart sounds.  No murmur heard. No BLE edema. Pulmonary/Chest: Effort normal and breath sounds normal. No respiratory distress. Abdominal: Soft. Bowel sounds are normal, no distension. There is no tenderness. no masses Breast: no lumps or masses, no nipple discharge or rashes FEMALE GENITALIA:  Not done  RECTAL: not done  Musculoskeletal: Normal range of motion, no joint effusions. No gross deformities Neurological: he is alert and oriented to person, place, and time. No cranial nerve deficit. Coordination, balance, strength, speech and gait are normal.  Skin: Skin is warm and dry. No rash noted. No erythema.  Psychiatric: Patient has a normal mood and affect. behavior is normal. Judgment and thought content normal.    Fall Risk:    10/20/2022    7:38 AM 10/14/2021    7:49 AM 04/01/2021    7:44 AM 10/08/2020    7:39 AM 08/28/2020    9:27  AM  Fall Risk   Falls in the past year? 0 0 0 0 0  Number falls in past yr: 0  0 0   Injury with Fall? 0  0 0   Risk for fall due to : No Fall Risks No Fall Risks No Fall Risks    Follow up Falls prevention discussed Falls prevention discussed;Falls  evaluation completed;Education provided Falls prevention discussed       Functional Status Survey: Is the patient deaf or have difficulty hearing?: No Does the patient have difficulty seeing, even when wearing glasses/contacts?: No Does the patient have difficulty concentrating, remembering, or making decisions?: No Does the patient have difficulty walking or climbing stairs?: No Does the patient have difficulty dressing or bathing?: No Does the patient have difficulty doing errands alone such as visiting a doctor's office or shopping?: No   Assessment & Plan  1. Well adult exam  - MM 3D SCREENING MAMMOGRAM BILATERAL BREAST; Future - Lipoprotein Fractionation, NMR - CBC with Differential/Platelet - COMPLETE METABOLIC PANEL WITH GFR - Hemoglobin A1c - VITAMIN D 25 Hydroxy (Vit-D Deficiency, Fractures)  2. Seropositive rheumatoid arthritis (HCC)  Seeing Rheumatologist   3. Vitamin D deficiency  We will check level   4. Pure hypercholesterolemia  - Lipoprotein Fractionation, NMR  5. Osteopenia after menopause  - VITAMIN D 25 Hydroxy (Vit-D Deficiency, Fractures)  6. Pre-diabetes  - Hemoglobin A1c  7. Long-term use of high-risk medication  - CBC with Differential/Platelet - COMPLETE METABOLIC PANEL WITH GFR  8. Breast cancer screening by mammogram  - MM 3D SCREENING MAMMOGRAM BILATERAL BREAST; Future    -USPSTF grade A and B recommendations reviewed with patient; age-appropriate recommendations, preventive care, screening tests, etc discussed and encouraged; healthy living encouraged; see AVS for patient education given to patient -Discussed importance of 150 minutes of physical activity weekly, eat two servings of fish weekly, eat one serving of tree nuts ( cashews, pistachios, pecans, almonds.Marland Kitchen) every other day, eat 6 servings of fruit/vegetables daily and drink plenty of water and avoid sweet beverages.   -Reviewed Health Maintenance: Yes.

## 2022-10-19 NOTE — Patient Instructions (Signed)
Preventive Care 62-62 Years Old, Female Preventive care refers to lifestyle choices and visits with your health care provider that can promote health and wellness. Preventive care visits are also called wellness exams. What can I expect for my preventive care visit? Counseling Your health care provider may ask you questions about your: Medical history, including: Past medical problems. Family medical history. Pregnancy history. Current health, including: Menstrual cycle. Method of birth control. Emotional well-being. Home life and relationship well-being. Sexual activity and sexual health. Lifestyle, including: Alcohol, nicotine or tobacco, and drug use. Access to firearms. Diet, exercise, and sleep habits. Work and work environment. Sunscreen use. Safety issues such as seatbelt and bike helmet use. Physical exam Your health care provider will check your: Height and weight. These may be used to calculate your BMI (body mass index). BMI is a measurement that tells if you are at a healthy weight. Waist circumference. This measures the distance around your waistline. This measurement also tells if you are at a healthy weight and may help predict your risk of certain diseases, such as type 2 diabetes and high blood pressure. Heart rate and blood pressure. Body temperature. Skin for abnormal spots. What immunizations do I need?  Vaccines are usually given at various ages, according to a schedule. Your health care provider will recommend vaccines for you based on your age, medical history, and lifestyle or other factors, such as travel or where you work. What tests do I need? Screening Your health care provider may recommend screening tests for certain conditions. This may include: Lipid and cholesterol levels. Diabetes screening. This is done by checking your blood sugar (glucose) after you have not eaten for a while (fasting). Pelvic exam and Pap test. Hepatitis B test. Hepatitis C  test. HIV (human immunodeficiency virus) test. STI (sexually transmitted infection) testing, if you are at risk. Lung cancer screening. Colorectal cancer screening. Mammogram. Talk with your health care provider about when you should start having regular mammograms. This may depend on whether you have a family history of breast cancer. BRCA-related cancer screening. This may be done if you have a family history of breast, ovarian, tubal, or peritoneal cancers. Bone density scan. This is done to screen for osteoporosis. Talk with your health care provider about your test results, treatment options, and if necessary, the need for more tests. Follow these instructions at home: Eating and drinking  Eat a diet that includes fresh fruits and vegetables, whole grains, lean protein, and low-fat dairy products. Take vitamin and mineral supplements as recommended by your health care provider. Do not drink alcohol if: Your health care provider tells you not to drink. You are pregnant, may be pregnant, or are planning to become pregnant. If you drink alcohol: Limit how much you have to 0-1 drink a day. Know how much alcohol is in your drink. In the [place] one drink equals one 12 oz bottle of beer (355 mL), one 5 oz glass of wine (148 mL), or one 1 oz glass of hard liquor (44 mL). Lifestyle Brush your teeth every morning and night with fluoride toothpaste. Floss one time each day. Exercise for at least 30 minutes 5 or more days each week. Do not use any products that contain nicotine or tobacco. These products include cigarettes, chewing tobacco, and vaping devices, such as e-cigarettes. If you need help quitting, ask your health care provider. Do not use drugs. If you are sexually active, practice safe sex. Use a condom or other form of protection to   prevent STIs. If you do not wish to become pregnant, use a form of birth control. If you plan to become pregnant, see your health care provider for a  prepregnancy visit. Take aspirin only as told by your health care provider. Make sure that you understand how much to take and what form to take. Work with your health care provider to find out whether it is safe and beneficial for you to take aspirin daily. Find healthy ways to manage stress, such as: Meditation, yoga, or listening to music. Journaling. Talking to a trusted person. Spending time with friends and family. Minimize exposure to UV radiation to reduce your risk of skin cancer. Safety Always wear your seat belt while driving or riding in a vehicle. Do not drive: If you have been drinking alcohol. Do not ride with someone who has been drinking. When you are tired or distracted. While texting. If you have been using any mind-altering substances or drugs. Wear a helmet and other protective equipment during sports activities. If you have firearms in your house, make sure you follow all gun safety procedures. Seek help if you have been physically or sexually abused. What's next? Visit your health care provider once a year for an annual wellness visit. Ask your health care provider how often you should have your eyes and teeth checked. Stay up to date on all vaccines. This information is not intended to replace advice given to you by your health care provider. Make sure you discuss any questions you have with your health care provider. Document Revised: 11/12/2020 Document Reviewed: 11/12/2020 Elsevier Patient Education  2023 [employer]  

## 2022-10-20 ENCOUNTER — Ambulatory Visit (INDEPENDENT_AMBULATORY_CARE_PROVIDER_SITE_OTHER): Payer: BC Managed Care – PPO | Admitting: Family Medicine

## 2022-10-20 ENCOUNTER — Encounter: Payer: Self-pay | Admitting: Family Medicine

## 2022-10-20 VITALS — BP 126/68 | HR 92 | Resp 16 | Ht 63.0 in | Wt 140.0 lb

## 2022-10-20 DIAGNOSIS — Z780 Asymptomatic menopausal state: Secondary | ICD-10-CM

## 2022-10-20 DIAGNOSIS — Z1231 Encounter for screening mammogram for malignant neoplasm of breast: Secondary | ICD-10-CM

## 2022-10-20 DIAGNOSIS — R7303 Prediabetes: Secondary | ICD-10-CM | POA: Diagnosis not present

## 2022-10-20 DIAGNOSIS — M059 Rheumatoid arthritis with rheumatoid factor, unspecified: Secondary | ICD-10-CM | POA: Diagnosis not present

## 2022-10-20 DIAGNOSIS — Z79899 Other long term (current) drug therapy: Secondary | ICD-10-CM

## 2022-10-20 DIAGNOSIS — E559 Vitamin D deficiency, unspecified: Secondary | ICD-10-CM | POA: Diagnosis not present

## 2022-10-20 DIAGNOSIS — Z0000 Encounter for general adult medical examination without abnormal findings: Secondary | ICD-10-CM

## 2022-10-20 DIAGNOSIS — M8580 Other specified disorders of bone density and structure, unspecified site: Secondary | ICD-10-CM | POA: Diagnosis not present

## 2022-10-20 DIAGNOSIS — E7800 Pure hypercholesterolemia, unspecified: Secondary | ICD-10-CM | POA: Diagnosis not present

## 2022-10-20 LAB — CBC WITH DIFFERENTIAL/PLATELET
Basophils Absolute: 41 cells/uL (ref 0–200)
Basophils Relative: 0.8 %
MCH: 29.5 pg (ref 27.0–33.0)

## 2022-10-21 LAB — COMPLETE METABOLIC PANEL WITH GFR
Alkaline phosphatase (APISO): 82 U/L (ref 37–153)
BUN: 7 mg/dL (ref 7–25)
CO2: 28 mmol/L (ref 20–32)
Chloride: 105 mmol/L (ref 98–110)
Sodium: 141 mmol/L (ref 135–146)

## 2022-10-21 LAB — CBC WITH DIFFERENTIAL/PLATELET
Eosinophils Absolute: 31 cells/uL (ref 15–500)
Neutro Abs: 2805 cells/uL (ref 1500–7800)
Neutrophils Relative %: 55 %
Platelets: 213 10*3/uL (ref 140–400)

## 2022-10-21 LAB — HEMOGLOBIN A1C: Hgb A1c MFr Bld: 5.7 % of total Hgb — ABNORMAL HIGH (ref <5.7)

## 2022-10-24 LAB — LIPOPROTEIN FRACTIONATION, NMR
HDL P: 37.6 umol/L (ref >32.8)
HDL Size: 8.5 nm — ABNORMAL LOW (ref >9.0)
LARGE HDL P: 4.8 umol/L — ABNORMAL LOW (ref >7.2)
LARGE VLDL P: 2.6 nmol/L (ref <3.7)
LDL P: 2029 nmol/L — ABNORMAL HIGH (ref <935)
LDL SIZE: 21.4 nm (ref >20.5)
SMALL LDL P: 473 nmol/L — ABNORMAL HIGH (ref <467)
VLDL Size: 41.6 nm (ref <47.1)

## 2022-10-24 LAB — CBC WITH DIFFERENTIAL/PLATELET
Absolute Monocytes: 362 cells/uL (ref 200–950)
Eosinophils Relative: 0.6 %
HCT: 42.1 % (ref 35.0–45.0)
Hemoglobin: 14 g/dL (ref 11.7–15.5)
Lymphs Abs: 1862 cells/uL (ref 850–3900)
MCHC: 33.3 g/dL (ref 32.0–36.0)
MCV: 88.8 fL (ref 80.0–100.0)
MPV: 11.5 fL (ref 7.5–12.5)
Monocytes Relative: 7.1 %
RBC: 4.74 10*6/uL (ref 3.80–5.10)
RDW: 13.2 % (ref 11.0–15.0)
Total Lymphocyte: 36.5 %
WBC: 5.1 10*3/uL (ref 3.8–10.8)

## 2022-10-24 LAB — HEMOGLOBIN A1C
Mean Plasma Glucose: 117 mg/dL
eAG (mmol/L): 6.5 mmol/L

## 2022-10-24 LAB — COMPLETE METABOLIC PANEL WITH GFR
AG Ratio: 1.4 (calc) (ref 1.0–2.5)
ALT: 13 U/L (ref 6–29)
AST: 18 U/L (ref 10–35)
Albumin: 4.6 g/dL (ref 3.6–5.1)
Calcium: 10.2 mg/dL (ref 8.6–10.4)
Creat: 0.78 mg/dL (ref 0.50–1.05)
Globulin: 3.3 g/dL (calc) (ref 1.9–3.7)
Glucose, Bld: 88 mg/dL (ref 65–99)
Potassium: 4.6 mmol/L (ref 3.5–5.3)
Total Bilirubin: 0.4 mg/dL (ref 0.2–1.2)
Total Protein: 7.9 g/dL (ref 6.1–8.1)
eGFR: 86 mL/min/{1.73_m2} (ref >=60)

## 2022-10-24 LAB — VITAMIN D 25 HYDROXY (VIT D DEFICIENCY, FRACTURES): Vit D, 25-Hydroxy: 47 ng/mL (ref 30–100)

## 2022-11-23 NOTE — Progress Notes (Unsigned)
Name: Yvonne Young   MRN: [id number]    DOB: 1961/04/21   Date:11/24/2022       Progress Note  Subjective  Chief Complaint  Follow Up  HPI  Familial hyperlipidemia: LDL has been above 160 for over 4 years. Last NMR showed large LDL-P and low HDL-p. She states her father died on a MVA at age 62 , mother is still living and does not have high cholesterol, however her maternal grandmother died of a stroke in her 60's. She eats healthy, she is physically active and is willing to try statin therapy.    RA: still feels stiff at times, but pain has been under control, she is compliant with medications and visits to rheumatologist. She is under the care of [facility] at [facility].  On Enbrel on Fridays and Methotrexate down to 2 pills every Thursday . No joint deformities   Pre-diabetes: HgbA1C was 5.8% in 2019 , last time it was 5.7 %, she has been walking 2 miles per day . She is being mindful with her diet also    Osteopenia: repeat bone density done 11/2021   FRAX* RESULTS:  (version: 3.5) 10-year Probability of Fracture1 Major Osteoporotic Fracture2 Hip Fracture 6.7% 1.2%  Patient Active Problem List   Diagnosis Date Noted   Pure hypercholesterolemia 04/01/2021   Osteopenia after menopause 04/01/2021   Pre-diabetes 04/01/2021   Sinus bradycardia 09/21/2017   Fibrocystic breast disease 07/01/2015   History of iron deficiency 07/01/2015   Allergic rhinitis, seasonal 07/01/2015   Vitamin D deficiency 07/01/2015   Seropositive rheumatoid arthritis (HCC) 03/11/2014    Past Surgical History:  Procedure Laterality Date   ABDOMINAL HYSTERECTOMY  05/31/1997   w/o bilateral oophorectomy   COLONOSCOPY     COLONOSCOPY WITH PROPOFOL N/A 08/03/2021   Procedure: COLONOSCOPY WITH PROPOFOL;  Surgeon: Toney Reil, MD;  Location: [facility] ENDOSCOPY;  Service: Gastroenterology;  Laterality: N/A;    Family History  Problem Relation Age of Onset   Hypothyroidism Mother     Hypertension Mother    Breast cancer Neg Hx     Social History   Tobacco Use   Smoking status: Never   Smokeless tobacco: Never  Substance Use Topics   Alcohol use: No    Alcohol/week: 0.0 standard drinks of alcohol     Current Outpatient Medications:    Cholecalciferol (VITAMIN D3) 50 MCG (2000 UT) CAPS, Take 1 capsule by mouth daily., Disp: , Rfl:    etanercept (ENBREL) 50 MG/ML injection, Inject into the skin., Disp: , Rfl:    methotrexate (RHEUMATREX) 2.5 MG tablet, Take 4 tablets (10 mg total) by mouth once a week., Disp: 16 tablet, Rfl: 0  Allergies  Allergen Reactions   Amoxicillin Nausea Only    Had nausea for 3 days, and this stopped when she discontinued the medication.    I personally reviewed active problem list, medication list, allergies, family history, social history, health maintenance with the patient/caregiver today.   ROS  Constitutional: Negative for fever or weight change.  Respiratory: Negative for cough and shortness of breath.   Cardiovascular: Negative for chest pain or palpitations.  Gastrointestinal: Negative for abdominal pain, no bowel changes.  Musculoskeletal: Negative for gait problem or joint swelling.  Skin: Negative for rash.  Neurological: Negative for dizziness or headache.  No other specific complaints in a complete review of systems (except as listed in HPI above).   Objective  Vitals:   11/24/22 0913  BP: 116/70  Pulse: 73  Resp: 16  Temp: 97.7 F (36.5 C)  TempSrc: Oral  SpO2: 99%  Weight: 140 lb 1.6 oz (63.5 kg)  Height: 5\' 3"  (1.6 m)    Body mass index is 24.82 kg/m.  Physical Exam  Constitutional: Patient appears well-developed and well-nourished.  No distress.  HEENT: head atraumatic, normocephalic, pupils equal and reactive to light,, neck supple Cardiovascular: Normal rate, regular rhythm and normal heart sounds.  No murmur heard. No BLE edema. Pulmonary/Chest: Effort normal and breath sounds normal. No  respiratory distress. Abdominal: Soft.  There is no tenderness. Psychiatric: Patient has a normal mood and affect. behavior is normal. Judgment and thought content normal.   Recent Results (from the past 2160 hour(s))  Lipoprotein Fractionation, NMR     Status: Abnormal   Collection Time: 10/20/22  8:23 AM  Result Value Ref Range   LDL P 2,029 (H) <935 nmol/L    Comment: This test was developed and its analytical performance characteristics have been determined by [facility]. It has not been cleared or approved by the U.S. Food and Drug Administration. This assay has been validated pursuant to the CLIA regulations and is used for clinical purposes.  Relative risk: Optimal <935; Moderate [phone]; High >1816 nmol/L.  Reference range is [id number] nmol/L.     SMALL LDL P 473 (H) <467 nmol/L    Comment: Relative risk: Optimal <467; Moderate [id number]; High >820 nmol/L.  Reference range is <1408 nmol/L.     LDL SIZE 21.4 >20.5 nm    Comment: Relative risk: Optimal >20.5; High <20.6 nm.  Reference range is 20.0-22.3 nm.     HDL P 37.6 >32.8 umol/L    Comment: Relative risk: Optimal >32.8; Moderate 29.2-32.8; High <29.2 umol/L.  Reference range is 21.1-43.4 umol/L.     LARGE HDL P 4.8 (L) >7.2 umol/L    Comment: Relative risk: Optimal >7.2; Moderate 5.3-7.2; High <5.3 umol/L.  Reference range is >3.5  umol/L.     HDL Size 8.5 (L) >9.0 nm    Comment: Relative risk: Optimal >9.0; Moderate 8.7-9.0; High <8.7 nm.  Reference range is 8.3-10.5 nm.     LARGE VLDL P 2.6 <3.7 nmol/L    Comment: Relative risk: Optimal <3.7; Moderate 3.7-6.1; High >6.1 nmol/L.  Reference range is <16.0 nmol/L.     VLDL Size 41.6 <47.1 nm    Comment: Relative risk: Optimal <47.1; Moderate 47.1-49.0; High >49.0 nm.  Reference range is 41.1-61.7 nm.    CBC with Differential/Platelet     Status: None   Collection Time: 10/20/22  8:23 AM  Result  Value Ref Range   WBC 5.1 3.8 - 10.8 Thousand/uL   RBC 4.74 3.80 - 5.10 Million/uL   Hemoglobin 14.0 11.7 - 15.5 g/dL   HCT 57.8 46.9 - 62.9 %   MCV 88.8 80.0 - 100.0 fL   MCH 29.5 27.0 - 33.0 pg   MCHC 33.3 32.0 - 36.0 g/dL   RDW 52.8 41.3 - 24.4 %   Platelets 213 140 - 400 Thousand/uL   MPV 11.5 7.5 - 12.5 fL   Neutro Abs 2,805 1,500 - 7,800 cells/uL   Lymphs Abs 1,862 850 - 3,900 cells/uL   Absolute Monocytes 362 200 - 950 cells/uL   Eosinophils Absolute 31 15 - 500 cells/uL   Basophils Absolute 41 0 - 200 cells/uL   Neutrophils Relative % 55 %   Total Lymphocyte 36.5 %   Monocytes Relative 7.1 %   Eosinophils Relative  0.6 %   Basophils Relative 0.8 %  COMPLETE METABOLIC PANEL WITH GFR     Status: None   Collection Time: 10/20/22  8:23 AM  Result Value Ref Range   Glucose, Bld 88 65 - 99 mg/dL    Comment: .            Fasting reference interval .    BUN 7 7 - 25 mg/dL   Creat 6.04 5.40 - 9.81 mg/dL   eGFR 86 > OR = 60 XB/JYN/8.29F6   BUN/Creatinine Ratio SEE NOTE: 6 - 22 (calc)    Comment:    Not Reported: BUN and Creatinine are within    reference range. .    Sodium 141 135 - 146 mmol/L   Potassium 4.6 3.5 - 5.3 mmol/L   Chloride 105 98 - 110 mmol/L   CO2 28 20 - 32 mmol/L   Calcium 10.2 8.6 - 10.4 mg/dL   Total Protein 7.9 6.1 - 8.1 g/dL   Albumin 4.6 3.6 - 5.1 g/dL   Globulin 3.3 1.9 - 3.7 g/dL (calc)   AG Ratio 1.4 1.0 - 2.5 (calc)   Total Bilirubin 0.4 0.2 - 1.2 mg/dL   Alkaline phosphatase (APISO) 82 37 - 153 U/L   AST 18 10 - 35 U/L   ALT 13 6 - 29 U/L  Hemoglobin A1c     Status: Abnormal   Collection Time: 10/20/22  8:23 AM  Result Value Ref Range   Hgb A1c MFr Bld 5.7 (H) <5.7 % of total Hgb    Comment: For someone without known diabetes, a hemoglobin  A1c value between 5.7% and 6.4% is consistent with prediabetes and should be confirmed with a  follow-up test. . For someone with known diabetes, a value <7% indicates that their diabetes is well  controlled. A1c targets should be individualized based on duration of diabetes, age, comorbid conditions, and other considerations. . This assay result is consistent with an increased risk of diabetes. . Currently, no consensus exists regarding use of hemoglobin A1c for diagnosis of diabetes for children. .    Mean Plasma Glucose 117 mg/dL   eAG (mmol/L) 6.5 mmol/L    Comment: . This test was performed on the Roche cobas c503 platform. Effective 03/08/22, a change in test platforms from the Abbott Architect to the Roche cobas c503 may have shifted HbA1c results compared to historical results. Based on laboratory validation testing conducted at [facility], the Roche platform relative to the Abbott platform had an average increase in HbA1c value of < or = 0.3%. This difference is within accepted  variability established by the [facility]. Note that not all individuals will have had a shift in their results and direct comparisons between historical and current results for testing conducted on different platforms is not recommended.   VITAMIN D 25 Hydroxy (Vit-D Deficiency, Fractures)     Status: None   Collection Time: 10/20/22  8:23 AM  Result Value Ref Range   Vit D, 25-Hydroxy 47 30 - 100 ng/mL    Comment: Vitamin D Status         25-OH Vitamin D: . Deficiency:                    <20 ng/mL Insufficiency:             20 - 29 ng/mL Optimal:                 > or = 30  ng/mL . For 25-OH Vitamin D testing on patients on  D2-supplementation and patients for whom quantitation  of D2 and D3 fractions is required, the [facility](TM) 25-OH VIT D, (D2,D3), LC/MS/MS is recommended: order  code 16109 (patients >52yrs). . See Note 1 . Note 1 . For additional information, please refer to  http://education.[facility].com/faq/FAQ199  (This link is being provided for informational/ educational purposes only.)     PHQ2/9:    11/24/2022     9:13 AM 10/20/2022    7:38 AM 10/14/2021    7:48 AM 04/01/2021    7:44 AM 10/08/2020    7:39 AM  Depression screen PHQ 2/9  Decreased Interest 0 0 0 0 0  Down, Depressed, Hopeless 0 0 0 0 0  PHQ - 2 Score 0 0 0 0 0  Altered sleeping 0 0 0 0   Tired, decreased energy 0 0 0 0   Change in appetite 0 0 0 0   Feeling bad or failure about yourself  0 0 0 0   Trouble concentrating 0 0 0 0   Moving slowly or fidgety/restless 0 0 0 0   Suicidal thoughts 0 0 0 0   PHQ-9 Score 0 0 0 0   Difficult doing work/chores Not difficult at all        phq 9 is negative   Fall Risk:    11/24/2022    9:13 AM 10/20/2022    7:38 AM 10/14/2021    7:49 AM 04/01/2021    7:44 AM 10/08/2020    7:39 AM  Fall Risk   Falls in the past year? 0 0 0 0 0  Number falls in past yr: 0 0  0 0  Injury with Fall? 0 0  0 0  Risk for fall due to : No Fall Risks No Fall Risks No Fall Risks No Fall Risks   Follow up Falls prevention discussed;Education provided;Falls evaluation completed Falls prevention discussed Falls prevention discussed;Falls evaluation completed;Education provided Falls prevention discussed       Functional Status Survey: Is the patient deaf or have difficulty hearing?: No Does the patient have difficulty seeing, even when wearing glasses/contacts?: No Does the patient have difficulty concentrating, remembering, or making decisions?: No Does the patient have difficulty walking or climbing stairs?: No Does the patient have difficulty dressing or bathing?: No Does the patient have difficulty doing errands alone such as visiting a doctor's office or shopping?: No    Assessment & Plan  1. Familial hyperlipidemia  - rosuvastatin (CRESTOR) 20 MG tablet; Take 1 tablet (20 mg total) by mouth daily.  Dispense: 90 tablet; Refill: 1  2. Seropositive rheumatoid arthritis (HCC)  Doing well  3. Pre-diabetes  Continue life style modification  4. Vitamin D deficiency  Continue supplementation

## 2022-11-24 ENCOUNTER — Encounter: Payer: Self-pay | Admitting: Family Medicine

## 2022-11-24 ENCOUNTER — Ambulatory Visit: Payer: BC Managed Care – PPO | Admitting: Family Medicine

## 2022-11-24 VITALS — BP 116/70 | HR 73 | Temp 97.7°F | Resp 16 | Ht 63.0 in | Wt 140.1 lb

## 2022-11-24 DIAGNOSIS — R7303 Prediabetes: Secondary | ICD-10-CM | POA: Diagnosis not present

## 2022-11-24 DIAGNOSIS — M059 Rheumatoid arthritis with rheumatoid factor, unspecified: Secondary | ICD-10-CM | POA: Diagnosis not present

## 2022-11-24 DIAGNOSIS — E7849 Other hyperlipidemia: Secondary | ICD-10-CM

## 2022-11-24 DIAGNOSIS — E559 Vitamin D deficiency, unspecified: Secondary | ICD-10-CM | POA: Diagnosis not present

## 2022-11-24 MED ORDER — ROSUVASTATIN CALCIUM 20 MG PO TABS
20.0000 mg | ORAL_TABLET | Freq: Every day | ORAL | 1 refills | Status: DC
Start: 2022-11-24 — End: 2023-05-26

## 2022-12-17 DIAGNOSIS — Z6823 Body mass index (BMI) 23.0-23.9, adult: Secondary | ICD-10-CM | POA: Diagnosis not present

## 2022-12-17 DIAGNOSIS — U071 COVID-19: Secondary | ICD-10-CM | POA: Diagnosis not present

## 2022-12-17 DIAGNOSIS — R509 Fever, unspecified: Secondary | ICD-10-CM | POA: Diagnosis not present

## 2022-12-21 ENCOUNTER — Encounter: Payer: Self-pay | Admitting: Family Medicine

## 2022-12-22 ENCOUNTER — Ambulatory Visit
Admission: RE | Admit: 2022-12-22 | Discharge: 2022-12-22 | Disposition: A | Discharge status: Home / Self Care | Payer: BC Managed Care – PPO | Source: Ambulatory Visit | Attending: Family Medicine | Admitting: Family Medicine

## 2022-12-22 DIAGNOSIS — Z0000 Encounter for general adult medical examination without abnormal findings: Secondary | ICD-10-CM | POA: Diagnosis not present

## 2022-12-22 DIAGNOSIS — Z1231 Encounter for screening mammogram for malignant neoplasm of breast: Secondary | ICD-10-CM | POA: Diagnosis not present

## 2023-01-19 DIAGNOSIS — Z79899 Other long term (current) drug therapy: Secondary | ICD-10-CM | POA: Diagnosis not present

## 2023-01-19 DIAGNOSIS — M0579 Rheumatoid arthritis with rheumatoid factor of multiple sites without organ or systems involvement: Secondary | ICD-10-CM | POA: Diagnosis not present

## 2023-03-09 ENCOUNTER — Ambulatory Visit: Payer: BC Managed Care – PPO

## 2023-03-09 ENCOUNTER — Ambulatory Visit (INDEPENDENT_AMBULATORY_CARE_PROVIDER_SITE_OTHER): Payer: BC Managed Care – PPO

## 2023-03-09 DIAGNOSIS — Z23 Encounter for immunization: Secondary | ICD-10-CM | POA: Diagnosis not present

## 2023-05-09 NOTE — Progress Notes (Signed)
Name: Yvonne Young   MRN: [id number]    DOB: 06-14-1960   Date:05/26/2023       Progress Note  Subjective  Chief Complaint  Chief Complaint  Patient presents with   Medical Management of Chronic Issues    HPI  Discussed the use of AI scribe software for clinical note transcription with the patient, who gave verbal consent to proceed.  History of Present Illness   The patient, diagnosed with familial hypercholesterolemia, rheumatoid arthritis, prediabetes, and osteopenia, reports significant improvement in her cholesterol levels since starting rosuvastatin 20mg  daily. She noticed a substantial decrease in her LDL-P from 2029 to 850 and small LDL-P from 473 to 384. However, she reports a persistently low HDL, which she attributes to dietary changes due to braces, limiting her intake of tree nuts.  Regarding her rheumatoid arthritis, the patient is on a regimen of Enbrel and methotrexate, which has resulted in significant improvement in joint stiffness and swelling. She reports zero pain and full range of motion, with her last sed rate at seven, indicating normal inflammatory markers.  The patient's prediabetes, with an A1c of 5.7, is being managed through dietary changes and physical activity. However, she reports a decrease in physical activity due to cold weather and braces, which limit her dietary options.  The patient also has osteopenia.  She is managing this condition with physical activity, a high calcium diet, and vitamin D supplements. She is due for a repeat bone density test in two years.  The patient's medications are being sent to her preferred pharmacy, and she is scheduled for a follow-up in a year.         Patient Active Problem List   Diagnosis Date Noted   Familial hyperlipidemia 11/24/2022   Pure hypercholesterolemia 04/01/2021   Osteopenia after menopause 04/01/2021   Pre-diabetes 04/01/2021   Sinus bradycardia 09/21/2017   Fibrocystic breast disease 07/01/2015    History of iron deficiency 07/01/2015   Allergic rhinitis, seasonal 07/01/2015   Vitamin D deficiency 07/01/2015   Seropositive rheumatoid arthritis (HCC) 03/11/2014    Past Surgical History:  Procedure Laterality Date   ABDOMINAL HYSTERECTOMY  05/31/1997   w/o bilateral oophorectomy   COLONOSCOPY     COLONOSCOPY WITH PROPOFOL N/A 08/03/2021   Procedure: COLONOSCOPY WITH PROPOFOL;  Surgeon: Toney Reil, MD;  Location: [facility] ENDOSCOPY;  Service: Gastroenterology;  Laterality: N/A;    Family History  Problem Relation Age of Onset   Hypothyroidism Mother    Hypertension Mother    Breast cancer Neg Hx     Social History   Tobacco Use   Smoking status: Never   Smokeless tobacco: Never  Substance Use Topics   Alcohol use: No    Alcohol/week: 0.0 standard drinks of alcohol     Current Outpatient Medications:    Cholecalciferol (VITAMIN D3) 50 MCG (2000 UT) CAPS, Take 1 capsule by mouth daily., Disp: , Rfl:    etanercept (ENBREL) 50 MG/ML injection, Inject into the skin., Disp: , Rfl:    folic acid (FOLVITE) 1 MG tablet, Take 1 mg by mouth daily., Disp: , Rfl:    methotrexate (RHEUMATREX) 2.5 MG tablet, Take 4 tablets (10 mg total) by mouth once a week., Disp: 16 tablet, Rfl: 0   rosuvastatin (CRESTOR) 20 MG tablet, Take 1 tablet (20 mg total) by mouth daily., Disp: 90 tablet, Rfl: 1  Allergies  Allergen Reactions   Amoxicillin Nausea Only    Had nausea for 3 days, and this stopped when  she discontinued the medication.    I personally reviewed active problem list, medication list, allergies, family history with the patient/caregiver today.   ROS  Ten systems reviewed and is negative except as mentioned in HPI    Objective  Vitals:   05/26/23 0830  BP: 98/64  Pulse: 67  Resp: 16  Temp: 97.9 F (36.6 C)  TempSrc: Oral  SpO2: 98%  Weight: 145 lb 1.6 oz (65.8 kg)  Height: 5\' 3"  (1.6 m)    Body mass index is 25.7 kg/m.  Physical  Exam  Constitutional: Patient appears well-developed and well-nourished.  No distress.  HEENT: head atraumatic, normocephalic, pupils equal and reactive to light, neck supple Cardiovascular: Normal rate, regular rhythm and normal heart sounds.  No murmur heard. No BLE edema. Pulmonary/Chest: Effort normal and breath sounds normal. No respiratory distress. Abdominal: Soft.  There is no tenderness. Psychiatric: Patient has a normal mood and affect. behavior is normal. Judgment and thought content normal.   Recent Results (from the past 2160 hours)  Lipid panel     Status: None   Collection Time: 05/18/23  8:04 AM  Result Value Ref Range   Cholesterol 133 <200 mg/dL   HDL 53 > OR = 50 mg/dL   Triglycerides 86 <161 mg/dL   LDL Cholesterol (Calc) 63 mg/dL (calc)    Comment: Reference range: <100 . Desirable range <100 mg/dL for primary prevention;   <70 mg/dL for patients with CHD or diabetic patients  with > or = 2 CHD risk factors. Marland Kitchen LDL-C is now calculated using the Martin-Hopkins  calculation, which is a validated novel method providing  better accuracy than the Friedewald equation in the  estimation of LDL-C.  Horald Pollen et al. Lenox Ahr. 0960;454(09): 2061-2068  (http://education.[facility].com/faq/FAQ164)    Total CHOL/HDL Ratio 2.5 <5.0 (calc)   Non-HDL Cholesterol (Calc) 80 <811 mg/dL (calc)    Comment: For patients with diabetes plus 1 major ASCVD risk  factor, treating to a non-HDL-C goal of <100 mg/dL  (LDL-C of <91 mg/dL) is considered a therapeutic  option.   Lipoprotein Fractionation, NMR     Status: Abnormal   Collection Time: 05/18/23  8:04 AM  Result Value Ref Range   LDL P 850 <935 nmol/L    Comment: This test was developed and its analytical performance characteristics have been determined by [facility]. It has not been cleared or approved by the U.S. Food and Drug Administration. This assay  has been validated pursuant to the CLIA regulations and is used for clinical purposes.  Relative risk: Optimal <935; Moderate [id number]; High >1816 nmol/L.  Reference range is [phone] nmol/L.     SMALL LDL P 384 <467 nmol/L    Comment: Relative risk: Optimal <467; Moderate [id number]; High >820 nmol/L.  Reference range is <1408 nmol/L.     LDL SIZE 20.8 >20.5 nm    Comment: Relative risk: Optimal >20.5; High <20.6 nm.  Reference range is 20.0-22.3 nm.     HDL P 39.0 >32.8 umol/L    Comment: Relative risk: Optimal >32.8; Moderate 29.2-32.8; High <29.2 umol/L.  Reference range is 21.1-43.4 umol/L.     LARGE HDL P 4.6 (L) >7.2 umol/L    Comment: Relative risk: Optimal >7.2; Moderate 5.3-7.2; High <5.3 umol/L.  Reference range is >3.5  umol/L.     HDL Size 8.7 (L) >9.0 nm    Comment: Relative risk: Optimal >9.0; Moderate 8.7-9.0; High <8.7 nm.  Reference range is 8.3-10.5  nm.     LARGE VLDL P <1.5 <3.7 nmol/L    Comment: Relative risk: Optimal <3.7; Moderate 3.7-6.1; High >6.1 nmol/L.  Reference range is <16.0 nmol/L.     VLDL Size 46.0 <47.1 nm    Comment: Relative risk: Optimal <47.1; Moderate 47.1-49.0; High >49.0 nm.  Reference range is 41.1-61.7 nm.    COMPLETE METABOLIC PANEL WITH GFR     Status: None   Collection Time: 05/18/23  8:04 AM  Result Value Ref Range   Glucose, Bld 93 65 - 99 mg/dL    Comment: .            Fasting reference interval .    BUN 8 7 - 25 mg/dL   Creat 6.96 2.95 - 2.84 mg/dL   eGFR 89 > OR = 60 XL/KGM/0.10U7   BUN/Creatinine Ratio SEE NOTE: 6 - 22 (calc)    Comment:    Not Reported: BUN and Creatinine are within    reference range. .    Sodium 142 135 - 146 mmol/L   Potassium 4.5 3.5 - 5.3 mmol/L   Chloride 106 98 - 110 mmol/L   CO2 26 20 - 32 mmol/L   Calcium 10.0 8.6 - 10.4 mg/dL   Total Protein 7.7 6.1 - 8.1 g/dL   Albumin 4.2 3.6 - 5.1 g/dL   Globulin 3.5 1.9 - 3.7 g/dL (calc)   AG Ratio 1.2 1.0 - 2.5 (calc)   Total Bilirubin 0.4 0.2  - 1.2 mg/dL   Alkaline phosphatase (APISO) 72 37 - 153 U/L   AST 19 10 - 35 U/L   ALT 16 6 - 29 U/L      PHQ2/9:    05/26/2023    8:30 AM 11/24/2022    9:13 AM 10/20/2022    7:38 AM 10/14/2021    7:48 AM 04/01/2021    7:44 AM  Depression screen PHQ 2/9  Decreased Interest 0 0 0 0 0  Down, Depressed, Hopeless 0 0 0 0 0  PHQ - 2 Score 0 0 0 0 0  Altered sleeping  0 0 0 0  Tired, decreased energy  0 0 0 0  Change in appetite  0 0 0 0  Feeling bad or failure about yourself   0 0 0 0  Trouble concentrating  0 0 0 0  Moving slowly or fidgety/restless  0 0 0 0  Suicidal thoughts  0 0 0 0  PHQ-9 Score  0 0 0 0  Difficult doing work/chores  Not difficult at all       phq 9 is negative   Fall Risk:    05/26/2023    8:24 AM 11/24/2022    9:13 AM 10/20/2022    7:38 AM 10/14/2021    7:49 AM 04/01/2021    7:44 AM  Fall Risk   Falls in the past year? 0 0 0 0 0  Number falls in past yr: 0 0 0  0  Injury with Fall? 0 0 0  0  Risk for fall due to : No Fall Risks No Fall Risks No Fall Risks No Fall Risks No Fall Risks  Follow up Falls prevention discussed;Education provided;Falls evaluation completed Falls prevention discussed;Education provided;Falls evaluation completed Falls prevention discussed Falls prevention discussed;Falls evaluation completed;Education provided Falls prevention discussed    Assessment & Plan      Familial Hypercholesterolemia Significant improvement in LDL-P and small LDL-P levels with rosuvastatin 20mg  daily. No reported side effects. HDL remains low, likely due  to dietary restrictions from braces. -Continue rosuvastatin 20mg  daily. -Encourage physical activity and consumption of tree nuts (almond butter suggested due to braces).  Prediabetes A1C at 5.7 in May. No symptoms of diabetes reported. Patient acknowledges dietary changes due to braces may be impacting diet. -Encourage more protein, less carbs once braces are removed. -Continue physical activity,  consider indoor options during cold weather.  Seropositive Rheumatoid Arthritis Well controlled on Enbrel and Methotrexate 2.5mg  (4 pills weekly). No joint stiffness or pain reported. Normal inflammatory markers. -Continue current treatment regimen and follow-up with rheumatologist.  Vitamin D Deficiency Patient reports taking over-the-counter supplement. -Continue Vitamin D supplement.  Osteopenia Low risk of broken bones currently. -Encourage physical activity, high calcium diet, and Vitamin D supplementation. -Repeat bone density in 2 years.  General Health Maintenance -Continue current immunization schedule. -Annual follow-up and physical when due. -Provide 1 year supply of current medications.

## 2023-05-15 ENCOUNTER — Other Ambulatory Visit: Payer: Self-pay | Admitting: Family Medicine

## 2023-05-15 DIAGNOSIS — E7849 Other hyperlipidemia: Secondary | ICD-10-CM

## 2023-05-18 DIAGNOSIS — E7849 Other hyperlipidemia: Secondary | ICD-10-CM | POA: Diagnosis not present

## 2023-05-21 LAB — LIPID PANEL
Cholesterol: 133 mg/dL (ref <200)
HDL: 53 mg/dL (ref >=50)
LDL Cholesterol (Calc): 63 mg/dL
Non-HDL Cholesterol (Calc): 80 mg/dL (ref <130)
Total CHOL/HDL Ratio: 2.5 (calc) (ref <5.0)
Triglycerides: 86 mg/dL (ref <150)

## 2023-05-21 LAB — COMPLETE METABOLIC PANEL WITH GFR
AG Ratio: 1.2 (calc) (ref 1.0–2.5)
ALT: 16 U/L (ref 6–29)
AST: 19 U/L (ref 10–35)
Albumin: 4.2 g/dL (ref 3.6–5.1)
Alkaline phosphatase (APISO): 72 U/L (ref 37–153)
BUN: 8 mg/dL (ref 7–25)
CO2: 26 mmol/L (ref 20–32)
Calcium: 10 mg/dL (ref 8.6–10.4)
Chloride: 106 mmol/L (ref 98–110)
Creat: 0.76 mg/dL (ref 0.50–1.05)
Globulin: 3.5 g/dL (ref 1.9–3.7)
Glucose, Bld: 93 mg/dL (ref 65–99)
Potassium: 4.5 mmol/L (ref 3.5–5.3)
Sodium: 142 mmol/L (ref 135–146)
Total Bilirubin: 0.4 mg/dL (ref 0.2–1.2)
Total Protein: 7.7 g/dL (ref 6.1–8.1)
eGFR: 89 mL/min/{1.73_m2} (ref >=60)

## 2023-05-21 LAB — LIPOPROTEIN FRACTIONATION, NMR
HDL P: 39 umol/L (ref >32.8)
HDL Size: 8.7 nmol — ABNORMAL LOW (ref >9.0)
LARGE HDL P: 4.6 umol/L — ABNORMAL LOW (ref >7.2)
LARGE VLDL P: <1.5 nmol/L (ref <3.7)
LDL P: 850 nmol/L (ref <935)
LDL SIZE: 20.8 nmol (ref >20.5)
SMALL LDL P: 384 nmol/L (ref <467)
VLDL Size: 46 nmol (ref <47.1)

## 2023-05-23 DIAGNOSIS — Z79899 Other long term (current) drug therapy: Secondary | ICD-10-CM | POA: Diagnosis not present

## 2023-05-23 DIAGNOSIS — M0579 Rheumatoid arthritis with rheumatoid factor of multiple sites without organ or systems involvement: Secondary | ICD-10-CM | POA: Diagnosis not present

## 2023-05-26 ENCOUNTER — Ambulatory Visit: Payer: BC Managed Care – PPO | Admitting: Family Medicine

## 2023-05-26 ENCOUNTER — Encounter: Payer: Self-pay | Admitting: Family Medicine

## 2023-05-26 VITALS — BP 98/64 | HR 67 | Temp 97.9°F | Resp 16 | Ht 63.0 in | Wt 145.1 lb

## 2023-05-26 DIAGNOSIS — R7303 Prediabetes: Secondary | ICD-10-CM

## 2023-05-26 DIAGNOSIS — M059 Rheumatoid arthritis with rheumatoid factor, unspecified: Secondary | ICD-10-CM | POA: Diagnosis not present

## 2023-05-26 DIAGNOSIS — E559 Vitamin D deficiency, unspecified: Secondary | ICD-10-CM

## 2023-05-26 DIAGNOSIS — M8580 Other specified disorders of bone density and structure, unspecified site: Secondary | ICD-10-CM

## 2023-05-26 DIAGNOSIS — E7849 Other hyperlipidemia: Secondary | ICD-10-CM | POA: Diagnosis not present

## 2023-05-26 DIAGNOSIS — Z780 Asymptomatic menopausal state: Secondary | ICD-10-CM

## 2023-05-26 MED ORDER — ROSUVASTATIN CALCIUM 20 MG PO TABS: 20.0000 mg | ORAL_TABLET | Freq: Every day | ORAL | 3 refills | Status: DC

## 2023-10-21 ENCOUNTER — Ambulatory Visit (INDEPENDENT_AMBULATORY_CARE_PROVIDER_SITE_OTHER): Admitting: Family Medicine

## 2023-10-21 ENCOUNTER — Encounter: Payer: Self-pay | Admitting: Family Medicine

## 2023-10-21 VITALS — BP 108/72 | HR 61 | Resp 16 | Ht 64.0 in | Wt 144.8 lb

## 2023-10-21 DIAGNOSIS — E559 Vitamin D deficiency, unspecified: Secondary | ICD-10-CM | POA: Diagnosis not present

## 2023-10-21 DIAGNOSIS — R7303 Prediabetes: Secondary | ICD-10-CM

## 2023-10-21 DIAGNOSIS — Z79899 Other long term (current) drug therapy: Secondary | ICD-10-CM | POA: Diagnosis not present

## 2023-10-21 DIAGNOSIS — Z1231 Encounter for screening mammogram for malignant neoplasm of breast: Secondary | ICD-10-CM | POA: Diagnosis not present

## 2023-10-21 DIAGNOSIS — E7800 Pure hypercholesterolemia, unspecified: Secondary | ICD-10-CM

## 2023-10-21 DIAGNOSIS — Z0000 Encounter for general adult medical examination without abnormal findings: Secondary | ICD-10-CM

## 2023-10-21 DIAGNOSIS — Z780 Asymptomatic menopausal state: Secondary | ICD-10-CM

## 2023-10-21 DIAGNOSIS — M8580 Other specified disorders of bone density and structure, unspecified site: Secondary | ICD-10-CM

## 2023-10-21 DIAGNOSIS — M059 Rheumatoid arthritis with rheumatoid factor, unspecified: Secondary | ICD-10-CM

## 2023-10-21 NOTE — Progress Notes (Signed)
 Name: Yvonne Young   MRN: [id number]    DOB: May 05, 1961   Date:10/21/2023       Progress Note  Subjective  Chief Complaint  Chief Complaint  Patient presents with   Annual Exam    HPI  Patient presents for annual CPE.  Diet: no longer has braces and has been eating a healthier diet, adding nuts now Exercise: walking at least 3 days a week for 2 miles usually  more   Last Eye Exam: completed Last Dental Exam: completed  Flowsheet Row Office Visit from 10/21/2023 in [facility]  AUDIT-C Score 0      Depression: Phq 9 is  negative    10/21/2023    8:27 AM 05/26/2023    8:30 AM 11/24/2022    9:13 AM 10/20/2022    7:38 AM 10/14/2021    7:48 AM  Depression screen PHQ 2/9  Decreased Interest 0 0 0 0 0  Down, Depressed, Hopeless 0 0 0 0 0  PHQ - 2 Score 0 0 0 0 0  Altered sleeping   0 0 0  Tired, decreased energy   0 0 0  Change in appetite   0 0 0  Feeling bad or failure about yourself    0 0 0  Trouble concentrating   0 0 0  Moving slowly or fidgety/restless   0 0 0  Suicidal thoughts   0 0 0  PHQ-9 Score   0 0 0  Difficult doing work/chores   Not difficult at all     Hypertension: BP Readings from Last 3 Encounters:  10/21/23 108/72  05/26/23 98/64  11/24/22 116/70   Obesity: Wt Readings from Last 3 Encounters:  10/21/23 144 lb 12.8 oz (65.7 kg)  05/26/23 145 lb 1.6 oz (65.8 kg)  11/24/22 140 lb 1.6 oz (63.5 kg)   BMI Readings from Last 3 Encounters:  10/21/23 24.85 kg/m  05/26/23 25.70 kg/m  11/24/22 24.82 kg/m     Vaccines: reviewed with the patient.   Hep C Screening: completed STD testing and prevention (HIV/chl/gon/syphilis): N/A Intimate partner violence: negative screen  Sexual History : not currently  Menstrual History/LMP/Abnormal Bleeding: s/p hysterectomy  Discussed importance of follow up if any post-menopausal bleeding: not applicable  Incontinence Symptoms: negative for symptoms   Breast cancer:  - Last  Mammogram: she will schedule it  - BRCA gene screening: N/A  Osteoporosis Prevention : Discussed high calcium  and vitamin D  supplementation, weight bearing exercises Bone density :yes , she is due for repeat   Cervical cancer screening: not applicable due to hysterectomy  Skin cancer: Discussed monitoring for atypical lesions  Colorectal cancer: up to date    Lung cancer:  Low Dose CT Chest recommended if Age 7-80 years, 20 pack-year currently smoking OR have quit w/in 15years. Patient does not qualify for screen   ECG: 2019  Advanced Care Planning: A voluntary discussion about advance care planning including the explanation and discussion of advance directives.  Discussed health care proxy and Living will, and the patient was able to identify a health care proxy as sister - Buford Carnes.  Patient does not have a living will and power of attorney of health care   Patient Active Problem List   Diagnosis Date Noted   Familial hyperlipidemia 11/24/2022   Pure hypercholesterolemia 04/01/2021   Osteopenia after menopause 04/01/2021   Pre-diabetes 04/01/2021   Sinus bradycardia 09/21/2017   Fibrocystic breast disease 07/01/2015   History of iron  deficiency 07/01/2015   Allergic rhinitis, seasonal 07/01/2015   Vitamin D  deficiency 07/01/2015   Seropositive rheumatoid arthritis (HCC) 03/11/2014    Past Surgical History:  Procedure Laterality Date   ABDOMINAL HYSTERECTOMY  05/31/1997   w/o bilateral oophorectomy   COLONOSCOPY     COLONOSCOPY WITH PROPOFOL  N/A 08/03/2021   Procedure: COLONOSCOPY WITH PROPOFOL ;  Surgeon: Selena Daily, MD;  Location: [facility] ENDOSCOPY;  Service: Gastroenterology;  Laterality: N/A;    Family History  Problem Relation Age of Onset   Hypothyroidism Mother    Hypertension Mother    Breast cancer Neg Hx     Social History   Socioeconomic History   Marital status: Single    Spouse name: Not on file   Number of children: 1   Years of education: 80    Highest education level: 12th grade  Occupational History   Occupation: provider template services    Employer: [employer]   Occupation: Control and instrumentation engineer: [id number]  Tobacco Use   Smoking status: Never   Smokeless tobacco: Never  Vaping Use   Vaping status: Never Used  Substance and Sexual Activity   Alcohol use: No    Alcohol/week: 0.0 standard drinks of alcohol   Drug use: No   Sexual activity: Not Currently    Partners: Male  Other Topics Concern   Not on file  Social History Narrative   Lives by herself, dating, she was married at age 52 and divorced at age 29 , re-married at age 28 and widow after one year.    She has two jobs - works for [employer] ( remote ) and also at [employer]   Social Drivers of [employer]: Low Risk  (10/21/2023)   Overall Financial Resource Strain (CARDIA)    Difficulty of Paying Living Expenses: Not hard at all  Food Insecurity: No Food Insecurity (10/21/2023)   Hunger Vital Sign    Worried About Running Out of Food in the Last Year: Never true    Ran Out of Food in the Last Year: Never true  Transportation Needs: No Transportation Needs (10/21/2023)   PRAPARE - Administrator, Civil Service (Medical): No    Lack of Transportation (Non-Medical): No  Physical Activity: Insufficiently Active (10/21/2023)   Exercise Vital Sign    Days of Exercise per Week: 3 days    Minutes of Exercise per Session: 30 min  Stress: No Stress Concern Present (10/21/2023)   [employer] of Occupational Health - Occupational Stress Questionnaire    Feeling of Stress : Not at all  Social Connections: Moderately Isolated (10/21/2023)   Social Connection and Isolation Panel [NHANES]    Frequency of Communication with Friends and Family: More than three times a week    Frequency of Social Gatherings with Friends and Family: More than three times a week    Attends Religious Services: More than 4 times per year     Active Member of [employer] or Organizations: No    Attends Banker Meetings: Never    Marital Status: Divorced  Catering manager Violence: Not At Risk (10/21/2023)   Humiliation, Afraid, Rape, and Kick questionnaire    Fear of Current or Ex-Partner: No    Emotionally Abused: No    Physically Abused: No    Sexually Abused: No     Current Outpatient Medications:    Cholecalciferol (VITAMIN D3) 50 MCG (2000 UT) CAPS, Take 1 capsule by mouth  daily., Disp: , Rfl:    etanercept (ENBREL) 50 MG/ML injection, Inject into the skin., Disp: , Rfl:    folic acid (FOLVITE) 1 MG tablet, Take 1 mg by mouth daily., Disp: , Rfl:    methotrexate  (RHEUMATREX) 2.5 MG tablet, Take 4 tablets (10 mg total) by mouth once a week., Disp: 16 tablet, Rfl: 0   rosuvastatin  (CRESTOR ) 20 MG tablet, Take 1 tablet (20 mg total) by mouth daily., Disp: 90 tablet, Rfl: 3  Allergies  Allergen Reactions   Amoxicillin Nausea Only    Had nausea for 3 days, and this stopped when she discontinued the medication.     ROS  Constitutional: Negative for fever or weight change.  Respiratory: Negative for cough and shortness of breath.   Cardiovascular: Negative for chest pain or palpitations.  Gastrointestinal: Negative for abdominal pain, no bowel changes.  Musculoskeletal: Negative for gait problem or joint swelling.  Skin: Negative for rash.  Neurological: Negative for dizziness or headache.  No other specific complaints in a complete review of systems (except as listed in HPI above).   Objective  Vitals:   10/21/23 0836  BP: 108/72  Pulse: 61  Resp: 16  SpO2: 100%  Weight: 144 lb 12.8 oz (65.7 kg)  Height: 5\' 4"  (1.626 m)    Body mass index is 24.85 kg/m.  Physical Exam  Constitutional: Patient appears well-developed and well-nourished. No distress.  HENT: Head: Normocephalic and atraumatic. Ears: B TMs ok, no erythema or effusion; Nose: Nose normal. Mouth/Throat: Oropharynx is clear and moist.  No oropharyngeal exudate.  Eyes: Conjunctivae and EOM are normal. Pupils are equal, round, and reactive to light. No scleral icterus.  Neck: Normal range of motion. Neck supple. No JVD present. No thyromegaly present.  Cardiovascular: Normal rate, regular rhythm and normal heart sounds.  No murmur heard. No BLE edema. Pulmonary/Chest: Effort normal and breath sounds normal. No respiratory distress. Abdominal: Soft. Bowel sounds are normal, no distension. There is no tenderness. no masses Breast: no lumps or masses, no nipple discharge or rashes FEMALE GENITALIA:  Not done  RECTAL: not done  Musculoskeletal: Normal range of motion, no joint effusions. No gross deformities Neurological: he is alert and oriented to person, place, and time. No cranial nerve deficit. Coordination, balance, strength, speech and gait are normal.  Skin: Skin is warm and dry. No rash noted. No erythema.  Psychiatric: Patient has a normal mood and affect. behavior is normal. Judgment and thought content normal.     Assessment & Plan  1. Well adult exam (Primary)  - MM 3D SCREENING MAMMOGRAM BILATERAL BREAST; Future - Lipid panel - CBC with Differential/Platelet - Comprehensive metabolic panel with GFR - VITAMIN D  25 Hydroxy (Vit-D Deficiency, Fractures) - Hemoglobin A1c - C-reactive protein - Sedimentation rate  2. Encounter for screening mammogram for malignant neoplasm of breast  - MM 3D SCREENING MAMMOGRAM BILATERAL BREAST; Future  3. Pre-diabetes  - Hemoglobin A1c  4. Vitamin D  deficiency  - VITAMIN D  25 Hydroxy (Vit-D Deficiency, Fractures)  5. Long-term use of high-risk medication  - CBC with Differential/Platelet - Comprehensive metabolic panel with GFR  6. Pure hypercholesterolemia  - Lipid panel  7. Seropositive rheumatoid arthritis (HCC)  - C-reactive protein - Sedimentation rate  8. Osteopenia after menopause  - DG Bone Density; Future    -USPSTF grade A and B  recommendations reviewed with patient; age-appropriate recommendations, preventive care, screening tests, etc discussed and encouraged; healthy living encouraged; see AVS for patient education given  to patient -Discussed importance of 150 minutes of physical activity weekly, eat two servings of fish weekly, eat one serving of tree nuts ( cashews, pistachios, pecans, almonds.Aaron Aas) every other day, eat 6 servings of fruit/vegetables daily and drink plenty of water and avoid sweet beverages.   -Reviewed Health Maintenance: Yes.

## 2023-10-22 LAB — LIPID PANEL
Cholesterol: 129 mg/dL (ref <200)
HDL: 56 mg/dL (ref >=50)
LDL Cholesterol (Calc): 55 mg/dL
Non-HDL Cholesterol (Calc): 73 mg/dL (ref <130)
Total CHOL/HDL Ratio: 2.3 (calc) (ref <5.0)
Triglycerides: 96 mg/dL (ref <150)

## 2023-10-22 LAB — CBC WITH DIFFERENTIAL/PLATELET
Absolute Lymphocytes: 2071 {cells}/uL (ref 850–3900)
Absolute Monocytes: 493 {cells}/uL (ref 200–950)
Basophils Absolute: 52 {cells}/uL (ref 0–200)
Basophils Relative: 0.9 %
Eosinophils Absolute: 41 {cells}/uL (ref 15–500)
Eosinophils Relative: 0.7 %
HCT: 44.5 % (ref 35.0–45.0)
Hemoglobin: 14.4 g/dL (ref 11.7–15.5)
MCH: 29.1 pg (ref 27.0–33.0)
MCHC: 32.4 g/dL (ref 32.0–36.0)
MCV: 90.1 fL (ref 80.0–100.0)
MPV: 11.8 fL (ref 7.5–12.5)
Monocytes Relative: 8.5 %
Neutro Abs: 3144 {cells}/uL (ref 1500–7800)
Neutrophils Relative %: 54.2 %
Platelets: 188 10*3/uL (ref 140–400)
RBC: 4.94 10*6/uL (ref 3.80–5.10)
RDW: 13.4 % (ref 11.0–15.0)
Total Lymphocyte: 35.7 %
WBC: 5.8 10*3/uL (ref 3.8–10.8)

## 2023-10-22 LAB — COMPREHENSIVE METABOLIC PANEL WITH GFR
AG Ratio: 1.5 (calc) (ref 1.0–2.5)
ALT: 22 U/L (ref 6–29)
AST: 27 U/L (ref 10–35)
Albumin: 4.6 g/dL (ref 3.6–5.1)
Alkaline phosphatase (APISO): 74 U/L (ref 37–153)
BUN: 11 mg/dL (ref 7–25)
CO2: 27 mmol/L (ref 20–32)
Calcium: 9.7 mg/dL (ref 8.6–10.4)
Chloride: 106 mmol/L (ref 98–110)
Creat: 0.78 mg/dL (ref 0.50–1.05)
Globulin: 3 g/dL (ref 1.9–3.7)
Glucose, Bld: 81 mg/dL (ref 65–99)
Potassium: 4.1 mmol/L (ref 3.5–5.3)
Sodium: 141 mmol/L (ref 135–146)
Total Bilirubin: 0.6 mg/dL (ref 0.2–1.2)
Total Protein: 7.6 g/dL (ref 6.1–8.1)
eGFR: 86 mL/min/{1.73_m2} (ref >=60)

## 2023-10-22 LAB — HEMOGLOBIN A1C
Hgb A1c MFr Bld: 5.8 % — ABNORMAL HIGH (ref <5.7)
Mean Plasma Glucose: 120 mg/dL
eAG (mmol/L): 6.6 mmol/L

## 2023-10-22 LAB — C-REACTIVE PROTEIN: CRP: <3 mg/L (ref <8.0)

## 2023-10-22 LAB — SEDIMENTATION RATE: Sed Rate: 6 mm/h (ref 0–30)

## 2023-10-22 LAB — VITAMIN D 25 HYDROXY (VIT D DEFICIENCY, FRACTURES): Vit D, 25-Hydroxy: 51 ng/mL (ref 30–100)

## 2023-10-25 ENCOUNTER — Ambulatory Visit: Payer: Self-pay | Admitting: Family Medicine

## 2023-11-07 DIAGNOSIS — Z79899 Other long term (current) drug therapy: Secondary | ICD-10-CM | POA: Diagnosis not present

## 2023-11-07 DIAGNOSIS — M0579 Rheumatoid arthritis with rheumatoid factor of multiple sites without organ or systems involvement: Secondary | ICD-10-CM | POA: Diagnosis not present

## 2023-12-28 ENCOUNTER — Ambulatory Visit
Admission: RE | Admit: 2023-12-28 | Discharge: 2023-12-28 | Disposition: A | Discharge status: Home / Self Care | Source: Ambulatory Visit | Attending: Family Medicine | Admitting: Family Medicine

## 2023-12-28 DIAGNOSIS — M8589 Other specified disorders of bone density and structure, multiple sites: Secondary | ICD-10-CM | POA: Diagnosis not present

## 2023-12-28 DIAGNOSIS — Z1231 Encounter for screening mammogram for malignant neoplasm of breast: Secondary | ICD-10-CM | POA: Insufficient documentation

## 2023-12-28 DIAGNOSIS — Z780 Asymptomatic menopausal state: Secondary | ICD-10-CM | POA: Diagnosis not present

## 2023-12-28 DIAGNOSIS — Z0000 Encounter for general adult medical examination without abnormal findings: Secondary | ICD-10-CM | POA: Diagnosis not present

## 2023-12-28 DIAGNOSIS — M8580 Other specified disorders of bone density and structure, unspecified site: Secondary | ICD-10-CM | POA: Insufficient documentation

## 2024-03-07 ENCOUNTER — Ambulatory Visit (INDEPENDENT_AMBULATORY_CARE_PROVIDER_SITE_OTHER)

## 2024-03-07 DIAGNOSIS — Z23 Encounter for immunization: Secondary | ICD-10-CM

## 2024-03-14 DIAGNOSIS — M0579 Rheumatoid arthritis with rheumatoid factor of multiple sites without organ or systems involvement: Secondary | ICD-10-CM | POA: Diagnosis not present

## 2024-03-14 DIAGNOSIS — Z79899 Other long term (current) drug therapy: Secondary | ICD-10-CM | POA: Diagnosis not present

## 2024-04-25 ENCOUNTER — Ambulatory Visit: Admitting: Family Medicine

## 2024-04-25 ENCOUNTER — Encounter: Payer: Self-pay | Admitting: Family Medicine

## 2024-04-25 VITALS — BP 110/72 | HR 68 | Resp 16 | Ht 64.0 in | Wt 146.4 lb

## 2024-04-25 DIAGNOSIS — Z23 Encounter for immunization: Secondary | ICD-10-CM | POA: Diagnosis not present

## 2024-04-25 DIAGNOSIS — M059 Rheumatoid arthritis with rheumatoid factor, unspecified: Secondary | ICD-10-CM | POA: Diagnosis not present

## 2024-04-25 DIAGNOSIS — R7303 Prediabetes: Secondary | ICD-10-CM

## 2024-04-25 DIAGNOSIS — M8580 Other specified disorders of bone density and structure, unspecified site: Secondary | ICD-10-CM

## 2024-04-25 DIAGNOSIS — E7800 Pure hypercholesterolemia, unspecified: Secondary | ICD-10-CM | POA: Diagnosis not present

## 2024-04-25 DIAGNOSIS — Z780 Asymptomatic menopausal state: Secondary | ICD-10-CM

## 2024-04-25 DIAGNOSIS — Z9225 Personal history of immunosupression therapy: Secondary | ICD-10-CM

## 2024-04-25 DIAGNOSIS — E559 Vitamin D deficiency, unspecified: Secondary | ICD-10-CM

## 2024-04-25 NOTE — Progress Notes (Signed)
 Name: Yvonne Young   MRN: [id number]    DOB: 08-09-1960   Date:04/25/2024       Progress Note  Subjective  Chief Complaint  Chief Complaint  Patient presents with   Medical Management of Chronic Issues   Discussed the use of AI scribe software for clinical note transcription with the patient, who gave verbal consent to proceed.  History of Present Illness Yvonne Young is a 63 year old female with seropositive rheumatoid arthritis who presents for a routine follow-up.  She manages her seropositive rheumatoid arthritis with methotrexate , taken as four pills on Thursdays, and Enbrel injections on Fridays. She reports stiffness in her hands occurring only in very cold conditions. She uses an exercise ball to manage stiffness and takes breaks from typing to stretch. She reports no significant morning stiffness, deformities, inflammation, or side effects from her medications.  She has hypercholesterolemia and is taking rosuvastatin . Her lipid panel in May showed her LDL cholesterol reduced to 55 mg/dL.  She has prediabetes, with A1c levels fluctuating between 5.4% and 5.8% over the past few years. She has made dietary changes, such as switching to no-sugar creamer in her coffee, to manage her blood sugar levels. She reports no symptoms of diabetes and maintains a healthy lifestyle.  She underwent a bone density test in July, which showed a slight decrease in bone mass. Her ten-year probability of a major osteoporotic fracture is 6.6%, and for a hip fracture, it is 1.1%.  Her family history includes some relatives with diabetes, but her mother does not have the condition. She works from home and is considering moving in with her 25 year old mother to provide care.    Patient Active Problem List   Diagnosis Date Noted   Familial hyperlipidemia 11/24/2022   Pure hypercholesterolemia 04/01/2021   Osteopenia after menopause 04/01/2021   Pre-diabetes 04/01/2021   Sinus bradycardia  09/21/2017   Fibrocystic breast disease 07/01/2015   History of iron deficiency 07/01/2015   Allergic rhinitis, seasonal 07/01/2015   Vitamin D  deficiency 07/01/2015   Seropositive rheumatoid arthritis (HCC) 03/11/2014    Past Surgical History:  Procedure Laterality Date   ABDOMINAL HYSTERECTOMY  05/31/1997   w/o bilateral oophorectomy   COLONOSCOPY     COLONOSCOPY WITH PROPOFOL  N/A 08/03/2021   Procedure: COLONOSCOPY WITH PROPOFOL ;  Surgeon: Unk Corinn Skiff, MD;  Location: [facility] ENDOSCOPY;  Service: Gastroenterology;  Laterality: N/A;    Family History  Problem Relation Age of Onset   Hypothyroidism Mother    Hypertension Mother    Breast cancer Neg Hx     Social History   Tobacco Use   Smoking status: Never   Smokeless tobacco: Never  Substance Use Topics   Alcohol use: No    Alcohol/week: 0.0 standard drinks of alcohol     Current Outpatient Medications:    Cholecalciferol (VITAMIN D3) 50 MCG (2000 UT) CAPS, Take 1 capsule by mouth daily., Disp: , Rfl:    etanercept (ENBREL) 50 MG/ML injection, Inject into the skin., Disp: , Rfl:    folic acid (FOLVITE) 1 MG tablet, Take 1 mg by mouth daily., Disp: , Rfl:    methotrexate  (RHEUMATREX) 2.5 MG tablet, Take 4 tablets (10 mg total) by mouth once a week., Disp: 16 tablet, Rfl: 0   rosuvastatin  (CRESTOR ) 20 MG tablet, Take 1 tablet (20 mg total) by mouth daily., Disp: 90 tablet, Rfl: 3  Allergies  Allergen Reactions   Amoxicillin Nausea Only    Had nausea for 3 days,  and this stopped when she discontinued the medication.    I personally reviewed active problem list, medication list, allergies, family history with the patient/caregiver today.   ROS  Ten systems reviewed and is negative except as mentioned in HPI    Objective Physical Exam CONSTITUTIONAL: Patient appears well-developed and well-nourished. No distress. HEENT: Head atraumatic, normocephalic, neck supple. CARDIOVASCULAR: Normal rate, regular rhythm  and normal heart sounds. No murmur heard. No BLE edema. PULMONARY: Effort normal and breath sounds normal. Lungs clear to auscultation. No respiratory distress. ABDOMINAL: There is no tenderness or distention. MUSCULOSKELETAL: Normal gait. Without gross motor or sensory deficit. PSYCHIATRIC: Patient has a normal mood and affect. Behavior is normal. Judgment and thought content normal.  Vitals:   04/25/24 0858  BP: 110/72  Pulse: 68  Resp: 16  SpO2: 97%  Weight: 146 lb 6.4 oz (66.4 kg)  Height: 5' 4 (1.626 m)    Body mass index is 25.13 kg/m.   PHQ2/9:    04/25/2024    8:52 AM 10/21/2023    8:27 AM 05/26/2023    8:30 AM 11/24/2022    9:13 AM 10/20/2022    7:38 AM  Depression screen PHQ 2/9  Decreased Interest 0 0 0 0 0  Down, Depressed, Hopeless 0 0 0 0 0  PHQ - 2 Score 0 0 0 0 0  Altered sleeping    0 0  Tired, decreased energy    0 0  Change in appetite    0 0  Feeling bad or failure about yourself     0 0  Trouble concentrating    0 0  Moving slowly or fidgety/restless    0 0  Suicidal thoughts    0 0  PHQ-9 Score    0  0   Difficult doing work/chores    Not difficult at all      Data saved with a previous flowsheet row definition    phq 9 is negative  Fall Risk:    04/25/2024    8:52 AM 10/21/2023    8:27 AM 05/26/2023    8:24 AM 11/24/2022    9:13 AM 10/20/2022    7:38 AM  Fall Risk   Falls in the past year? 0 0 0 0 0  Number falls in past yr: 0 0 0 0 0  Injury with Fall? 0 0 0 0 0  Risk for fall due to : No Fall Risks No Fall Risks No Fall Risks No Fall Risks No Fall Risks  Follow up Falls evaluation completed Falls prevention discussed;Education provided;Falls evaluation completed Falls prevention discussed;Education provided;Falls evaluation completed Falls prevention discussed;Education provided;Falls evaluation completed Falls prevention discussed     Assessment & Plan Rheumatoid arthritis with rheumatoid factor Well-controlled with methotrexate   and Enbrel. No morning stiffness, deformities, or medication side effects. Immunosuppressive therapy requires infection monitoring. - Continue methotrexate  and Enbrel as prescribed. - Encouraged regular exercise and stretching.  Pure hypercholesterolemia Well-managed with rosuvastatin . LDL at 55, below target. - Continue rosuvastatin  as prescribed.  Prediabetes A1c at 5.8. Family history of diabetes. No symptoms. - Continue lifestyle modifications to reduce sugar intake. - Monitor A1c levels regularly.  Disorder of bone density and structure Slight decrease in bone density. 10-year fracture risk: major osteoporotic 6.6%, hip 1.1%. - Engage in physical activity with weighted exercises. - Maintain a high calcium  diet. - Take vitamin D  supplements daily.  Vitamin D  deficiency Managed with daily supplementation. - Continue daily vitamin D  supplementation.  General Health Maintenance  Routine health maintenance up to date. RSV vaccine recommended due to immunosuppressive therapy. - Obtain RSV vaccine at a local pharmacy. - Continue routine health maintenance and screenings.

## 2024-05-07 ENCOUNTER — Other Ambulatory Visit: Payer: Self-pay | Admitting: Family Medicine

## 2024-05-07 DIAGNOSIS — E7849 Other hyperlipidemia: Secondary | ICD-10-CM

## 2024-05-08 NOTE — Telephone Encounter (Signed)
 Requested Prescriptions  Pending Prescriptions Disp Refills   rosuvastatin  (CRESTOR ) 20 MG tablet [Pharmacy Med Name: Rosuvastatin  Calcium  20 MG Oral Tablet] 90 tablet 0    Sig: Take 1 tablet by mouth once daily     Cardiovascular:  Antilipid - Statins 2 Failed - 05/08/2024  4:24 PM      Failed - Lipid Panel in normal range within the last 12 months    Cholesterol, Total  Date Value Ref Range Status  07/01/2015 207 (H) 100 - 199 mg/dL Final   Cholesterol  Date Value Ref Range Status  10/21/2023 129 <200 mg/dL Final   LDL Cholesterol (Calc)  Date Value Ref Range Status  10/21/2023 55 mg/dL (calc) Final    Comment:    Reference range: <100 . Desirable range <100 mg/dL for primary prevention;   <70 mg/dL for patients with CHD or diabetic patients  with > or = 2 CHD risk factors. SABRA LDL-C is now calculated using the Martin-Hopkins  calculation, which is a validated novel method providing  better accuracy than the Friedewald equation in the  estimation of LDL-C.  Gladis APPLETHWAITE et al. SANDREA. 7986;689(80): 2061-2068  (http://education.[facility].com/faq/FAQ164)    HDL  Date Value Ref Range Status  10/21/2023 56 > OR = 50 mg/dL Final  98/68/7982 63 >60 mg/dL Final   Triglycerides  Date Value Ref Range Status  10/21/2023 96 <150 mg/dL Final         Passed - Cr in normal range and within 360 days    Creat  Date Value Ref Range Status  10/21/2023 0.78 0.50 - 1.05 mg/dL Final         Passed - Patient is not pregnant      Passed - Valid encounter within last 12 months    Recent Outpatient Visits           1 week ago Seropositive rheumatoid arthritis [facility])   [place] [facility] Glenard Mire, MD   6 months ago Well adult exam   [facility] Glenard Mire, MD       Future Appointments             In 5 months Glenard, Krichna, MD [facility], [place]

## 2024-10-24 ENCOUNTER — Encounter: Admitting: Family Medicine

## 2025-04-29 ENCOUNTER — Ambulatory Visit: Admitting: Family Medicine
